# Patient Record
Sex: Male | Born: 1980
Health system: Southern US, Community
[De-identification: ages and names within clinical notes are randomized; demographics above are authoritative.]

## PROBLEM LIST (undated history)

## (undated) DIAGNOSIS — E785 Hyperlipidemia, unspecified: Secondary | ICD-10-CM

## (undated) DIAGNOSIS — N200 Calculus of kidney: Secondary | ICD-10-CM

## (undated) HISTORY — PX: KIDNEY STONE SURGERY: SHX686

## (undated) HISTORY — DX: Hyperlipidemia, unspecified: E78.5

## (undated) HISTORY — DX: Calculus of kidney: N20.0

---

## 2018-12-25 DIAGNOSIS — N200 Calculus of kidney: Secondary | ICD-10-CM | POA: Diagnosis not present

## 2019-06-26 DIAGNOSIS — N200 Calculus of kidney: Secondary | ICD-10-CM | POA: Diagnosis not present

## 2019-06-27 DIAGNOSIS — N202 Calculus of kidney with calculus of ureter: Secondary | ICD-10-CM | POA: Diagnosis not present

## 2019-07-26 DIAGNOSIS — Z03818 Encounter for observation for suspected exposure to other biological agents ruled out: Secondary | ICD-10-CM | POA: Diagnosis not present

## 2019-08-16 DIAGNOSIS — N200 Calculus of kidney: Secondary | ICD-10-CM | POA: Diagnosis not present

## 2019-08-29 ENCOUNTER — Other Ambulatory Visit: Payer: Self-pay

## 2019-08-29 ENCOUNTER — Ambulatory Visit (INDEPENDENT_AMBULATORY_CARE_PROVIDER_SITE_OTHER): Payer: BC Managed Care – PPO | Admitting: Family Medicine

## 2019-08-29 ENCOUNTER — Encounter: Payer: Self-pay | Admitting: Family Medicine

## 2019-08-29 VITALS — BP 102/72 | HR 67 | Temp 97.0°F | Ht 66.0 in | Wt 179.0 lb

## 2019-08-29 DIAGNOSIS — M79672 Pain in left foot: Secondary | ICD-10-CM

## 2019-08-29 DIAGNOSIS — N200 Calculus of kidney: Secondary | ICD-10-CM

## 2019-08-29 DIAGNOSIS — E785 Hyperlipidemia, unspecified: Secondary | ICD-10-CM

## 2019-08-29 NOTE — Patient Instructions (Addendum)

## 2019-08-29 NOTE — Progress Notes (Signed)
Chief Complaint  Patient presents with  . New Patient (Initial Visit)       New Patient Visit SUBJECTIVE: HPI: Devin Bauer is an 39 y.o.male who is being seen for establishing care.  +several year hx of recurrent kidney stones. Was told in [place] that he has Ca oxalate stones. Had a 24 hr collection showing increased Ca. Also has a stone analysis pending with urology in the area. He is taking Flomax 0.4 mg daily for this. Unsure what he needs to do from a dietary standpoint. Continues to get these every 3-4 months and they are quite painful. Reports having had 30-40 in his life.   2 mo of L heel pain. Started walking 2 mo ago. No bruising, swelling or redness. Hurts at night and worse when walking. Did get new shoes. Has not tried anything else. Not changing in severity.  Hx of high cholesterol. Started on Lipitor 20 mg/d. Reports compliance, no AE's. Diet is healthy. Walking for exercise.   Past Medical History:  Diagnosis Date  . Hyperlipidemia   . Kidney stone    Past Surgical History:  Procedure Laterality Date  . KIDNEY STONE SURGERY     Family History  Problem Relation Age of Onset  . Hypertension Father    No Known Allergies  Current Outpatient Medications:  .  atorvastatin (LIPITOR) 20 MG tablet, Take 20 mg by mouth daily., Disp: , Rfl:  .  tamsulosin (FLOMAX) 0.4 MG CAPS capsule, Take 0.4 capsules by mouth daily., Disp: , Rfl:    OBJECTIVE: BP 102/72 (BP Location: Left Arm, Patient Position: Sitting, Cuff Size: Normal)   Pulse 67   Temp (!) 97 F (36.1 C) (Temporal)   Ht 5\' 6"  (1.676 m)   Wt 179 lb (81.2 kg)   SpO2 97%   BMI 28.89 kg/m  General:  well developed, well nourished, in no apparent distress Skin:  no significant moles, warts, or growths Nose:  nares patent, septum midline, mucosa normal Throat/Pharynx:  lips and gingiva without lesion; tongue and uvula midline; non-inflamed pharynx; no exudates or postnasal drainage Lungs:  clear to auscultation,  breath sounds equal bilaterally, no respiratory distress Cardio:  regular rate and rhythm, no LE edema or bruits Musculoskeletal: +ttp on bottom of heel (not insertion of plantar fascia); no chest wall ttp; symmetrical muscle groups noted without atrophy or deformity Neuro:  gait normal Psych: well oriented with normal range of affect and appropriate judgment/insight  ASSESSMENT/PLAN: Recurrent kidney stones  Pain of left heel  Hyperlipidemia, unspecified hyperlipidemia type  1- Await workup from urology. If no good plan, will refer to [facility]. Diet info provided today. 2- Heel cups. Supportive shoes. Podiatry if no better. 3- Cont statin. Counseled on diet/exercises.  Patient should return in 3 mo for CPE. The patient voiced understanding and agreement to the plan.   [facility] [place], DO 08/29/19  8:14 AM

## 2019-09-12 ENCOUNTER — Emergency Department (HOSPITAL_BASED_OUTPATIENT_CLINIC_OR_DEPARTMENT_OTHER)
Admission: EM | Admit: 2019-09-12 | Discharge: 2019-09-12 | Disposition: A | Discharge status: Home / Self Care | Payer: BC Managed Care – PPO | Attending: Emergency Medicine | Admitting: Emergency Medicine

## 2019-09-12 ENCOUNTER — Other Ambulatory Visit: Payer: Self-pay

## 2019-09-12 ENCOUNTER — Emergency Department (HOSPITAL_BASED_OUTPATIENT_CLINIC_OR_DEPARTMENT_OTHER): Payer: BC Managed Care – PPO

## 2019-09-12 ENCOUNTER — Encounter (HOSPITAL_BASED_OUTPATIENT_CLINIC_OR_DEPARTMENT_OTHER): Payer: Self-pay | Admitting: Emergency Medicine

## 2019-09-12 DIAGNOSIS — N201 Calculus of ureter: Secondary | ICD-10-CM | POA: Diagnosis not present

## 2019-09-12 DIAGNOSIS — R109 Unspecified abdominal pain: Secondary | ICD-10-CM

## 2019-09-12 DIAGNOSIS — R1032 Left lower quadrant pain: Secondary | ICD-10-CM | POA: Diagnosis not present

## 2019-09-12 LAB — CBC WITH DIFFERENTIAL/PLATELET
Abs Immature Granulocytes: 0.02 10*3/uL (ref 0.00–0.07)
Basophils Absolute: 0 10*3/uL (ref 0.0–0.1)
Basophils Relative: 1 %
Eosinophils Absolute: 0.1 10*3/uL (ref 0.0–0.5)
Eosinophils Relative: 2 %
HCT: 42.2 % (ref 39.0–52.0)
Hemoglobin: 14.1 g/dL (ref 13.0–17.0)
Immature Granulocytes: 0 %
Lymphocytes Relative: 38 %
Lymphs Abs: 2.7 10*3/uL (ref 0.7–4.0)
MCH: 29.6 pg (ref 26.0–34.0)
MCHC: 33.4 g/dL (ref 30.0–36.0)
MCV: 88.5 fL (ref 80.0–100.0)
Monocytes Absolute: 0.4 10*3/uL (ref 0.1–1.0)
Monocytes Relative: 6 %
Neutro Abs: 3.8 10*3/uL (ref 1.7–7.7)
Neutrophils Relative %: 53 %
Platelets: 272 10*3/uL (ref 150–400)
RBC: 4.77 MIL/uL (ref 4.22–5.81)
RDW: 12.5 % (ref 11.5–15.5)
WBC: 7.1 10*3/uL (ref 4.0–10.5)
nRBC: 0 % (ref 0.0–0.2)

## 2019-09-12 LAB — BASIC METABOLIC PANEL
Anion gap: 11 (ref 5–15)
BUN: 7 mg/dL (ref 6–20)
CO2: 24 mmol/L (ref 22–32)
Calcium: 8.9 mg/dL (ref 8.9–10.3)
Chloride: 102 mmol/L (ref 98–111)
Creatinine, Ser: 1.22 mg/dL (ref 0.61–1.24)
GFR calc Af Amer: >60 mL/min (ref >60)
GFR calc non Af Amer: >60 mL/min (ref >60)
Glucose, Bld: 139 mg/dL — ABNORMAL HIGH (ref 70–99)
Potassium: 3.7 mmol/L (ref 3.5–5.1)
Sodium: 137 mmol/L (ref 135–145)

## 2019-09-12 LAB — URINALYSIS, MICROSCOPIC (REFLEX)

## 2019-09-12 LAB — URINALYSIS, ROUTINE W REFLEX MICROSCOPIC
Bilirubin Urine: NEGATIVE
Glucose, UA: NEGATIVE mg/dL
Ketones, ur: NEGATIVE mg/dL
Leukocytes,Ua: NEGATIVE
Nitrite: NEGATIVE
Protein, ur: NEGATIVE mg/dL
Specific Gravity, Urine: 1.015 (ref 1.005–1.030)
pH: 7 (ref 5.0–8.0)

## 2019-09-12 MED ORDER — HYDROMORPHONE HCL 1 MG/ML IJ SOLN
INTRAMUSCULAR | Status: AC
  Administered 2019-09-12: 1 mg via INTRAVENOUS
  Filled 2019-09-12: qty 1

## 2019-09-12 MED ORDER — ONDANSETRON HCL 4 MG/2ML IJ SOLN: 4.0000 mg | Freq: Once | INTRAMUSCULAR | Status: AC

## 2019-09-12 MED ORDER — TAMSULOSIN HCL 0.4 MG PO CAPS: 0.4000 mg | ORAL_CAPSULE | Freq: Every day | ORAL | 0 refills | Status: DC

## 2019-09-12 MED ORDER — HYDROMORPHONE HCL 1 MG/ML IJ SOLN: 1.0000 mg | Freq: Once | INTRAMUSCULAR | Status: AC

## 2019-09-12 MED ORDER — ONDANSETRON HCL 4 MG/2ML IJ SOLN
INTRAMUSCULAR | Status: AC
  Administered 2019-09-12: 4 mg via INTRAVENOUS
  Filled 2019-09-12: qty 2

## 2019-09-12 NOTE — Discharge Instructions (Signed)
You were seen in the emergency department and found to have a kidney stone.  We are sending you home with multiple medications to assist with passing the stone:   -Flomax-this is a medication to help pass the stone, it allows urine to exit the body more freely.  Please take this once daily with a meal.  -Ibuprofen 800 mg-this is a medication that will help with pain as well as passing the stone.  Please take this every 8 hours.  Take this with food as it can cause stomach upset and at worst stomach bleeding.  Do not take other NSAIDs such as Motrin, Aleve, Advil, Mobic, or Naproxen with this medicine as they are similar and would propagate any potential side effects.   Oxycodone-this is a narcotic/controlled substance medication that has potential addicting qualities.  We recommend that you take 1-2 tablets every 6 hours as needed for severe pain.  Do not drive or operate heavy machinery when taking this medicine as it can be sedating. Do not drink alcohol or take other sedating medications when taking this medicine for safety reasons.  Keep this out of reach of small children.  Please be aware this medicine has Tylenol in it (325 mg/tab) do not exceed the maximum dose of Tylenol in a day per over the counter recommendations should you decide to supplement with Tylenol over the counter.    Please follow-up with your urologist.  Return to the ER for new or worsening symptoms including but not limited to worsening pain not controlled by these medicines, inability to keep fluids down, fever, or any other concerns that you may have.

## 2019-09-12 NOTE — ED Notes (Signed)
Pt c/o left flank pain 10/10.  Dr. Donnald Garre notified, orders received.

## 2019-09-12 NOTE — ED Triage Notes (Signed)
Pt here with left flank pain since this morning and knows it is a kidney stone. Hx of kidney stones

## 2019-09-12 NOTE — ED Provider Notes (Signed)
[facility] EMERGENCY DEPARTMENT Provider Note   CSN: [id number] Arrival date & time: 09/12/19  1000     History Chief Complaint  Patient presents with  . Flank Pain    Devin Bauer is a 39 y.o. male.  Devin Bauer is a 39 y.o. male with a history of hyperlipidemia and kidney stones, who presents for sudden onset of severe left flank pain that began at about 8:15 this morning.  Pain is present in the left lower quadrant and radiates up to the left flank and down to the groin.  Pain is constant sharp and stabbing.  No associated nausea or vomiting.  No diarrhea or constipation.  He has not had any burning or pain with urination and has not noted any blood in his urine but he states he does not usually see this with his previous kidney stones.  States he has passed a total of 15 previous kidney stones, did have to have lithotripsy once but has not had to have any stenting or surgeries previously.  States that his stones are usually calcium oxalate and his urologist, Dr. Pete Glatter at [facility] has asked him to start drinking large amounts of water with lemon to try to reduce the number of stones he has.  Nothing for pain prior to arrival.  No other aggravating or alleviating factors.        Past Medical History:  Diagnosis Date  . Hyperlipidemia   . Kidney stone     Patient Active Problem List   Diagnosis Date Noted  . Recurrent kidney stones 08/29/2019  . Hyperlipidemia 08/29/2019    Past Surgical History:  Procedure Laterality Date  . KIDNEY STONE SURGERY         Family History  Problem Relation Age of Onset  . Hypertension Father     Social History   Tobacco Use  . Smoking status: Never Smoker  . Smokeless tobacco: Never Used  Substance Use Topics  . Alcohol use: Yes    Comment: once a week a beer  . Drug use: Never    Home Medications Prior to Admission medications   Medication Sig Start Date End Date Taking? Authorizing Provider    atorvastatin (LIPITOR) 20 MG tablet Take 20 mg by mouth daily. 07/18/19   [provider]  tamsulosin (FLOMAX) 0.4 MG CAPS capsule Take 1 capsule (0.4 mg total) by mouth daily. 09/12/19   Dartha Lodge, PA-C    Allergies    Patient has no known allergies.  Review of Systems   Review of Systems  Physical Exam Updated Vital Signs BP 122/88 (BP Location: Left Arm)   Pulse 71   Temp 98.2 F (36.8 C) (Oral)   Resp 16   SpO2 100%   Physical Exam Vitals and nursing note reviewed.  Constitutional:      General: He is not in acute distress.    Appearance: Normal appearance. He is well-developed. He is not diaphoretic.     Comments: Patient appears uncomfortable on arrival but is in no acute distress  HENT:     Head: Normocephalic and atraumatic.  Eyes:     General:        Right eye: No discharge.        Left eye: No discharge.     Pupils: Pupils are equal, round, and reactive to light.  Cardiovascular:     Rate and Rhythm: Normal rate and regular rhythm.     Heart sounds: Normal heart sounds.  Pulmonary:  Effort: Pulmonary effort is normal. No respiratory distress.     Breath sounds: Normal breath sounds. No wheezing or rales.     Comments: Respirations equal and unlabored, patient able to speak in full sentences, lungs clear to auscultation bilaterally Abdominal:     General: Bowel sounds are normal. There is no distension.     Palpations: Abdomen is soft. There is no mass.     Tenderness: There is abdominal tenderness. There is left CVA tenderness. There is no guarding.     Comments: Abdomen soft, nondistended, bowel sounds present throughout, there is some tenderness in the left lower quadrant without guarding, all other quadrants nontender, patient with left-sided CVA tenderness.  Musculoskeletal:        General: No deformity.     Cervical back: Neck supple.  Skin:    General: Skin is warm and dry.     Capillary Refill: Capillary refill takes less than 2  seconds.  Neurological:     Mental Status: He is alert.     Coordination: Coordination normal.     Comments: Speech is clear, able to follow commands CN III-XII intact Normal strength in upper and lower extremities bilaterally including dorsiflexion and plantar flexion, strong and equal grip strength Sensation normal to light and sharp touch Moves extremities without ataxia, coordination intact  Psychiatric:        Mood and Affect: Mood normal.        Behavior: Behavior normal.     ED Results / Procedures / Treatments   Labs (all labs ordered are listed, but only abnormal results are displayed) Labs Reviewed  URINALYSIS, ROUTINE W REFLEX MICROSCOPIC - Abnormal; Notable for the following components:      Result Value   Hgb urine dipstick MODERATE (*)    All other components within normal limits  BASIC METABOLIC PANEL - Abnormal; Notable for the following components:   Glucose, Bld 139 (*)    All other components within normal limits  URINALYSIS, MICROSCOPIC (REFLEX) - Abnormal; Notable for the following components:   Bacteria, UA FEW (*)    All other components within normal limits  CBC WITH DIFFERENTIAL/PLATELET    EKG None  Radiology CT Renal Stone Study  Result Date: 09/12/2019 CLINICAL DATA:  Flank pain.  Kidney stone expected. EXAM: CT ABDOMEN AND PELVIS WITHOUT CONTRAST TECHNIQUE: Multidetector CT imaging of the abdomen and pelvis was performed following the standard protocol without IV contrast. COMPARISON:  None. FINDINGS: Lower chest: Lung bases are clear. Hepatobiliary: No focal hepatic lesion. No biliary duct dilatation. Gallbladder is normal. Common bile duct is normal. Pancreas: Pancreas is normal. No ductal dilatation. No pancreatic inflammation. Spleen: Normal spleen Adrenals/urinary tract: Adrenal glands are normal. Mild LEFT pelvicaliectasis and LEFT hydroureter. There is an obstructing calculus in the distal LEFT ureter at the vesicoureteral junction measuring 4  mm on image 73/2. There are 4 additional nonobstructing calculi within the LEFT kidney ranging size from 2-4 mm. Five calculi in the RIGHT kidney ranging size from 1-6 mm. No RIGHT hydronephrosis or hydroureter. There is a nonobstructing calculus in the distal RIGHT ureter measuring 4 mm (image 67/2). This distal RIGHT ureteral calculus is approximately 2 cm from the RIGHT vesicoureteral junction. No bladder calculi. Stomach/Bowel: Stomach, small bowel, appendix, and cecum are normal. The colon and rectosigmoid colon are normal. Vascular/Lymphatic: Abdominal aorta is normal caliber. No periportal or retroperitoneal adenopathy. No pelvic adenopathy. Reproductive: Prostate unremarkable Other: No free fluid. Musculoskeletal: Is IMPRESSION: 1. Obstructing calculus in the distal  LEFT ureter at the LEFT vesicoureteral junction. 2. Nonobstructing calculus in the distal RIGHT ureter several cm from the RIGHT vesicoureteral junction. 3. Bilateral nephrolithiasis. Electronically Signed   By: Suzy Bouchard M.D.   On: 09/12/2019 12:04    Procedures Procedures (including critical care time)  Medications Ordered in ED Medications  HYDROmorphone (DILAUDID) injection 1 mg (1 mg Intravenous Given 09/12/19 1037)  ondansetron (ZOFRAN) injection 4 mg (4 mg Intravenous Given 09/12/19 1037)    ED Course  I have reviewed the triage vital signs and the nursing notes.  Pertinent labs & imaging results that were available during my care of the patient were reviewed by me and considered in my medical decision making (see chart for details).    MDM Rules/Calculators/A&P                          Patient presents to the ED with complaints of flank/abdominal pain history of multiple kidney stones previously and this feels similar, is followed by Dr. Felipa Eth with [facility] urology in [facility]. Patient nontoxic appearing, vitals without significant abnormalities. On exam patient is tender left lower quadrant and over the  left flank, no peritoneal signs. DDX: nephrolithiasis, pyelonephritis/UTI, cholecystitis, pancreatitis, bowel obstruction/perforation, appendicitis, dissection, feel nephrolithiasis is most likely at this time will evaluate with labs, analgesics, anti-emetics, and fluids ordered.  Had shared decision-making discussion with patient given that this feels like his usual kidney stone and he has consistent history of the same feel it is reasonable to start with labs and pain control, but patient states he has not had imaging regarding his kidney stones in about 4 years, he has been trying methods to reduce his production of kidney stones as recommended by your urologist and would like to know how many stones he has in his kidneys at this point.  No leukocytosis, anemia, or significant electrolyte derangements.  CT renal study with 4 mm left-sided obstructing UVJ stone with associated left-sided hydro-, patient also has a nonobstructing calculus in the right distal ureter, 4 intrarenal stones noted on the right, and 5 on the left. Renal function preserved. Urinalysis without appearance of superimposed infection. Patient tolerating PO in the ER with pain well controlled. Will discharge home with Flomax, NSAID, and oxycodone with urology follow up.  Patient has already been prescribed oxycodone by his urologist and states he has plenty at home.  I discussed results, treatment plan, need for urology follow-up, and return precautions with the patient. Provided opportunity for questions, patient confirmed understanding and is in agreement with plan.   Final Clinical Impression(s) / ED Diagnoses Final diagnoses:  Left flank pain  Left ureteral calculus    Rx / DC Orders ED Discharge Orders         Ordered    tamsulosin (FLOMAX) 0.4 MG CAPS capsule  Daily     Discontinue  Reprint     09/12/19 1331           Benedetto Goad [place], [place] 09/12/19 1752    Charlesetta Shanks, MD 09/13/19 1150

## 2019-09-14 ENCOUNTER — Other Ambulatory Visit: Payer: Self-pay | Admitting: Family Medicine

## 2019-09-14 DIAGNOSIS — N200 Calculus of kidney: Secondary | ICD-10-CM

## 2019-11-02 DIAGNOSIS — N200 Calculus of kidney: Secondary | ICD-10-CM | POA: Diagnosis not present

## 2019-11-08 DIAGNOSIS — N200 Calculus of kidney: Secondary | ICD-10-CM | POA: Diagnosis not present

## 2019-12-05 ENCOUNTER — Encounter: Payer: Self-pay | Admitting: Family Medicine

## 2019-12-05 ENCOUNTER — Other Ambulatory Visit: Payer: Self-pay

## 2019-12-05 ENCOUNTER — Ambulatory Visit (INDEPENDENT_AMBULATORY_CARE_PROVIDER_SITE_OTHER): Payer: BC Managed Care – PPO | Admitting: Family Medicine

## 2019-12-05 VITALS — BP 108/80 | HR 77 | Temp 97.9°F | Ht 66.0 in | Wt 168.5 lb

## 2019-12-05 DIAGNOSIS — M722 Plantar fascial fibromatosis: Secondary | ICD-10-CM

## 2019-12-05 NOTE — Patient Instructions (Addendum)
Ice/cold pack over area for 10-15 min twice daily.  OK to take Tylenol 1000 mg (2 extra strength tabs) or 975 mg (3 regular strength tabs) every 6 hours as needed.  Ibuprofen 400-600 mg (2-3 over the counter strength tabs) every 6 hours as needed for pain.  Consider a [place] sock to wear at night.   Plantar Fasciitis Stretches/exercises Do exercises exactly as told by your health care provider and adjust them as directed. It is normal to feel mild stretching, pulling, tightness, or discomfort as you do these exercises, but you should stop right away if you feel sudden pain or your pain gets worse.   Stretching and range of motion exercises These exercises warm up your muscles and joints and improve the movement and flexibility of your foot. These exercises also help to relieve pain.  Exercise A: Plantar fascia stretch 1. Sit with your left / right leg crossed over your opposite knee. 2. Hold your heel with one hand with that thumb near your arch. With your other hand, hold your toes and gently pull them back toward the top of your foot. You should feel a stretch on the bottom of your toes or your foot or both. 3. Hold this stretch for 30 seconds. 4. Slowly release your toes and return to the starting position. Repeat 2 times. Complete this exercise 3 times per week.  Exercise B: Gastroc, standing 1. Stand with your hands against a wall. 2. Extend your left / right leg behind you, and bend your front knee slightly. 3. Keeping your heels on the floor and keeping your back knee straight, shift your weight toward the wall without arching your back. You should feel a gentle stretch in your left / right calf. 4. Hold this position for 30 seconds. Repeat 2 times. Complete this exercise 3 times a week. Exercise C: Soleus, standing 1. Stand with your hands against a wall. 2. Extend your left / right leg behind you, and bend your front knee slightly. 3. Keeping your heels on the floor, bend  your back knee and slightly shift your weight over the back leg. You should feel a gentle stretch deep in your calf. 4. Hold this position for 30 seconds. Repeat 2 times. Complete this exercise 3 times per week. Exercise D: Gastrocsoleus, standing 1. Stand with the ball of your left / right foot on a step. The ball of your foot is on the walking surface, right under your toes. 2. Keep your other foot firmly on the same step. 3. Hold onto the wall or a railing for balance. 4. Slowly lift your other foot, allowing your body weight to press your heel down over the edge of the step. You should feel a stretch in your left / right calf. 5. Hold this position for 30 seconds. 6. Return both feet to the step. 7. Repeat this exercise with a slight bend in your left / right knee. Repeat 2 times with your left / right knee straight and 2times with your left / right knee bent. Complete this exercise 3 times a week.  Balance exercise This exercise builds your balance and strength control of your arch to help take pressure off your plantar fascia. Exercise E: Single leg stand 1. Without shoes, stand near a railing or in a doorway. You may hold onto the railing or door frame as needed. 2. Stand on your left / right foot. Keep your big toe down on the floor and try to keep your arch  lifted. Do not let your foot roll inward. 3. Hold this position for 30 seconds. 4. If this exercise is too easy, you can try it with your eyes closed or while standing on a pillow. Repeat 2 times. Complete this exercise 3 times per week. This information is not intended to replace advice given to you by your health care provider. Make sure you discuss any questions you have with your health care provider. Document Released: 03/15/2005 Document Revised: 11/18/2015 Document Reviewed: 01/27/2015 Elsevier Interactive Patient Education  2017 [employer].

## 2019-12-05 NOTE — Progress Notes (Signed)
Musculoskeletal Exam  Patient: Devin Bauer DOB: 06-Feb-1981  DOS: 12/05/2019  SUBJECTIVE:  Chief Complaint:   Chief Complaint  Patient presents with   Leg Pain   Foot Pain    Ollie Esty is a 39 y.o.  male for evaluation and treatment of L heel pain.   Onset:  4 months ago. No inj or change in activity.  Location: L heel Character:  aching and sharp  Progression of issue:  is unchanged Associated symptoms: No swelling, bruising, redness Treatment: to date has been rest and ice.   Neurovascular symptoms: no Worse in AM.  Got new shoes with better support.   Past Medical History:  Diagnosis Date   Hyperlipidemia    Kidney stone     Objective: VITAL SIGNS: BP 108/80 (BP Location: Left Arm, Patient Position: Sitting, Cuff Size: Normal)    Pulse 77    Temp 97.9 F (36.6 C) (Oral)    Ht 5\' 6"  (1.676 m)    Wt 168 lb 8 oz (76.4 kg)    SpO2 99%    BMI 27.20 kg/m  Constitutional: Well formed, well developed. No acute distress. Thorax & Lungs: No accessory muscle use Musculoskeletal: L heel.   Tenderness to palpation: yes over heel and prox insertion of PF Deformity: no Ecchymosis: no Neurologic: Normal sensory function.  Psychiatric: Normal mood. Age appropriate judgment and insight. Alert & oriented x 3.    Assessment:  Plantar fasciitis  Plan: Stretches/exercises, heat, ice, Tylenol. [place] sock.  F/u as originally scheduled in 3 weeks. If no improvement, would consider injection vs podiatry referral.  The patient voiced understanding and agreement to the plan.   [place], DO 12/05/19  8:05 AM

## 2019-12-11 ENCOUNTER — Encounter: Payer: BC Managed Care – PPO | Admitting: Family Medicine

## 2019-12-25 ENCOUNTER — Ambulatory Visit (INDEPENDENT_AMBULATORY_CARE_PROVIDER_SITE_OTHER): Payer: BC Managed Care – PPO | Admitting: Family Medicine

## 2019-12-25 ENCOUNTER — Encounter: Payer: Self-pay | Admitting: Family Medicine

## 2019-12-25 ENCOUNTER — Other Ambulatory Visit: Payer: Self-pay

## 2019-12-25 VITALS — BP 108/68 | HR 82 | Temp 98.0°F | Ht 66.0 in | Wt 166.2 lb

## 2019-12-25 DIAGNOSIS — Z1159 Encounter for screening for other viral diseases: Secondary | ICD-10-CM

## 2019-12-25 DIAGNOSIS — E789 Disorder of lipoprotein metabolism, unspecified: Secondary | ICD-10-CM | POA: Diagnosis not present

## 2019-12-25 DIAGNOSIS — Z0000 Encounter for general adult medical examination without abnormal findings: Secondary | ICD-10-CM

## 2019-12-25 DIAGNOSIS — Z114 Encounter for screening for human immunodeficiency virus [HIV]: Secondary | ICD-10-CM | POA: Diagnosis not present

## 2019-12-25 DIAGNOSIS — M79672 Pain in left foot: Secondary | ICD-10-CM | POA: Diagnosis not present

## 2019-12-25 DIAGNOSIS — Z23 Encounter for immunization: Secondary | ICD-10-CM

## 2019-12-25 NOTE — Patient Instructions (Addendum)
Give [place] 2-3 business days to get the results of your labs back.   Keep the diet clean and stay active.  Do monthly self testicular checks in the shower. You are feeling for lumps/bumps that don't belong. If you feel anything like this, let me know!  If you do not hear anything about your referral in the next few days, call our office and ask for an update.  Let us know if you need anything.

## 2019-12-25 NOTE — Progress Notes (Signed)
Chief Complaint  Patient presents with  . Annual Exam    Well Male Devin Bauer is here for a complete physical.   His last physical was >1 year ago.  Current diet: in general, a "healthy" diet.   Current exercise: walking, cycling Weight trend: has lost 13 lbs intentionally Fatigue out of ordinary? No. Seat belt? Yes.    Health maintenance Tetanus- Yes HIV- No Hep C- No  Past Medical History:  Diagnosis Date  . Hyperlipidemia   . Kidney stone      Past Surgical History:  Procedure Laterality Date  . KIDNEY STONE SURGERY      Medications  Current Outpatient Medications on File Prior to Visit  Medication Sig Dispense Refill  . atorvastatin (LIPITOR) 20 MG tablet Take 20 mg by mouth daily.     Allergies No Known Allergies  Family History Family History  Problem Relation Age of Onset  . Hypertension Father     Review of Systems: Constitutional: no fevers or chills Eye:  no recent significant change in vision Ear/Nose/Mouth/Throat:  Ears:  no hearing loss Nose/Mouth/Throat:  no complaints of nasal congestion, no sore throat Cardiovascular:  no chest pain Respiratory:  no shortness of breath Gastrointestinal:  no abdominal pain, no change in bowel habits GU:  Male: negative for dysuria Musculoskeletal/Extremities: +continued L heel pain Integumentary (Skin/Breast):  no abnormal skin lesions reported Neurologic:  no headaches Endocrine: No unexpected weight changes Hematologic/Lymphatic:  no night sweats  Exam BP 108/68 (BP Location: Left Arm, Patient Position: Sitting, Cuff Size: Normal)   Pulse 82   Temp 98 F (36.7 C) (Oral)   Ht 5\' 6"  (1.676 m)   Wt 166 lb 4 oz (75.4 kg)   SpO2 99%   BMI 26.83 kg/m  General:  well developed, well nourished, in no apparent distress Skin:  no significant moles, warts, or growths Head:  no masses, lesions, or tenderness Eyes:  pupils equal and round, sclera anicteric without injection Ears:  canals without lesions,  TMs shiny without retraction, no obvious effusion, no erythema Nose:  nares patent, septum midline, mucosa normal Throat/Pharynx:  lips and gingiva without lesion; tongue and uvula midline; non-inflamed pharynx; no exudates or postnasal drainage Neck: neck supple without adenopathy, thyromegaly, or masses Lungs:  clear to auscultation, breath sounds equal bilaterally, no respiratory distress Cardio:  regular rate and rhythm, no bruits, no LE edema Abdomen:  abdomen soft, nontender; bowel sounds normal; no masses or organomegaly Genital (male): circumcised penis, no lesions or discharge; testes present bilaterally without masses or tenderness Rectal: Deferred Musculoskeletal:  symmetrical muscle groups noted without atrophy or deformity Extremities:  no clubbing, cyanosis, or edema, no deformities, no skin discoloration Neuro:  gait normal; deep tendon reflexes normal and symmetric Psych: well oriented with normal range of affect and appropriate judgment/insight  Assessment and Plan  Well adult exam - Plan: Lipid panel, Comprehensive metabolic panel, CBC  Need for influenza vaccination - Plan: Flu Vaccine QUAD 6+ mos PF IM (Fluarix Quad PF)  Pain of left heel - Plan: Ambulatory referral to Sports Medicine  Encounter for hepatitis C screening test for low risk patient - Plan: Hepatitis C antibody  Screening for HIV (human immunodeficiency virus) - Plan: HIV Antibody (routine testing w rflx)   Well 39 y.o. male. Counseled on diet and exercise. Self testicular exams recommended at least monthly.  Other orders as above. He is going to stop the Lipitor if numbers are good, I am OK with this but would  want to recheck in 2 months while off of it. Pt is OK with this plan.  Follow up in 1 year pending the above workup. The patient voiced understanding and agreement to the plan.  Jilda Roche Braddock, DO 12/25/19 10:08 AM

## 2019-12-26 ENCOUNTER — Other Ambulatory Visit: Payer: Self-pay | Admitting: Family Medicine

## 2019-12-26 DIAGNOSIS — E785 Hyperlipidemia, unspecified: Secondary | ICD-10-CM

## 2019-12-26 LAB — COMPREHENSIVE METABOLIC PANEL
AG Ratio: 2 (calc) (ref 1.0–2.5)
ALT: 29 U/L (ref 9–46)
AST: 24 U/L (ref 10–40)
Albumin: 4.9 g/dL (ref 3.6–5.1)
Alkaline phosphatase (APISO): 70 U/L (ref 36–130)
BUN: 7 mg/dL (ref 7–25)
CO2: 27 mmol/L (ref 20–32)
Calcium: 9.9 mg/dL (ref 8.6–10.3)
Chloride: 103 mmol/L (ref 98–110)
Creat: 1.14 mg/dL (ref 0.60–1.35)
Globulin: 2.4 g/dL (calc) (ref 1.9–3.7)
Glucose, Bld: 81 mg/dL (ref 65–99)
Potassium: 4.5 mmol/L (ref 3.5–5.3)
Sodium: 140 mmol/L (ref 135–146)
Total Bilirubin: 0.6 mg/dL (ref 0.2–1.2)
Total Protein: 7.3 g/dL (ref 6.1–8.1)

## 2019-12-26 LAB — CBC
HCT: 43.6 % (ref 38.5–50.0)
Hemoglobin: 14.2 g/dL (ref 13.2–17.1)
MCH: 28.5 pg (ref 27.0–33.0)
MCHC: 32.6 g/dL (ref 32.0–36.0)
MCV: 87.6 fL (ref 80.0–100.0)
MPV: 10.9 fL (ref 7.5–12.5)
Platelets: 249 10*3/uL (ref 140–400)
RBC: 4.98 10*6/uL (ref 4.20–5.80)
RDW: 13 % (ref 11.0–15.0)
WBC: 5.1 10*3/uL (ref 3.8–10.8)

## 2019-12-26 LAB — LIPID PANEL
Cholesterol: 96 mg/dL (ref <200)
HDL: 26 mg/dL — ABNORMAL LOW (ref >=40)
LDL Cholesterol (Calc): 49 mg/dL (calc)
Non-HDL Cholesterol (Calc): 70 mg/dL (calc) (ref <130)
Total CHOL/HDL Ratio: 3.7 (calc) (ref <5.0)
Triglycerides: 120 mg/dL (ref <150)

## 2019-12-26 LAB — HIV ANTIBODY (ROUTINE TESTING W REFLEX): HIV 1&2 Ab, 4th Generation: NONREACTIVE

## 2019-12-26 LAB — HEPATITIS C ANTIBODY
Hepatitis C Ab: NONREACTIVE
SIGNAL TO CUT-OFF: 0.01 (ref <1.00)

## 2020-01-04 ENCOUNTER — Ambulatory Visit: Payer: BC Managed Care – PPO | Admitting: Family Medicine

## 2020-01-04 NOTE — Progress Notes (Deleted)
   Subjective:    I'm seeing this patient as a consultation for:  Dr. Carmelia Roller. Note will be routed back to referring provider/PCP.  CC: L heel pain  I, Coye Dawood, LAT, ATC, am serving as scribe for Dr. Clementeen Graham.  HPI: Pt is a 39 y/o male presenting w/ c/o L heel pain.  He locates his pain to .  Radiating pain: Aggravating factors: Treatments tried:  Past medical history, Surgical history, Family history, Social history, Allergies, and medications have been entered into the medical record, reviewed. ***  Review of Systems: No new headache, visual changes, nausea, vomiting, diarrhea, constipation, dizziness, abdominal pain, skin rash, fevers, chills, night sweats, weight loss, swollen lymph nodes, body aches, joint swelling, muscle aches, chest pain, shortness of breath, mood changes, visual or auditory hallucinations.   Objective:   There were no vitals filed for this visit. General: Well Developed, well nourished, and in no acute distress.  Neuro/Psych: Alert and oriented x3, extra-ocular muscles intact, able to move all 4 extremities, sensation grossly intact. Skin: Warm and dry, no rashes noted.  Respiratory: Not using accessory muscles, speaking in full sentences, trachea midline.  Cardiovascular: Pulses palpable, no extremity edema. Abdomen: Does not appear distended. MSK: ***  Lab and Radiology Results No results found for this or any previous visit (from the past 72 hour(s)). No results found.  Impression and Recommendations:    Assessment and Plan: 39 y.o. male with ***.  PDMP not reviewed this encounter. No orders of the defined types were placed in this encounter.  No orders of the defined types were placed in this encounter.   Discussed warning signs or symptoms. Please see discharge instructions. Patient expresses understanding.   ***

## 2020-01-07 ENCOUNTER — Ambulatory Visit (INDEPENDENT_AMBULATORY_CARE_PROVIDER_SITE_OTHER): Payer: BC Managed Care – PPO | Admitting: Family Medicine

## 2020-01-07 ENCOUNTER — Encounter: Payer: Self-pay | Admitting: Family Medicine

## 2020-01-07 ENCOUNTER — Other Ambulatory Visit: Payer: Self-pay

## 2020-01-07 VITALS — BP 129/90 | HR 68 | Ht 66.0 in | Wt 167.0 lb

## 2020-01-07 DIAGNOSIS — M722 Plantar fascial fibromatosis: Secondary | ICD-10-CM | POA: Diagnosis not present

## 2020-01-07 MED ORDER — PENNSAID 2 % EX SOLN: 1.0000 "application " | Freq: Two times a day (BID) | CUTANEOUS | 2 refills | Status: DC

## 2020-01-07 NOTE — Assessment & Plan Note (Signed)
Findings consistent with plantar fasciitis.  Has a fairly flat foot which may be contributing. -Counseled on home exercise therapy and supportive care. -Midfoot arch strap. -Pennsaid and samples provided. -Referral to physical therapy. -Could consider injection.

## 2020-01-07 NOTE — Progress Notes (Signed)
Medication Samples have been provided to the patient.  Drug name: Pennsaid       Strength: 2%        Qty: 1 box  LOT: [id number]  Exp.Date: 05/2020  Dosing instructions: use a pea size amount and rub gently.  The patient has been instructed regarding the correct time, dose, and frequency of taking this medication, including desired effects and most common side effects.   Kathi Simpers, MA 10:11 AM 01/07/2020

## 2020-01-07 NOTE — Progress Notes (Signed)
Devin Bauer - 39 y.o. male MRN [id number]  Date of birth: 03-09-1981  SUBJECTIVE:  Including CC & ROS.  Chief Complaint  Patient presents with  . Foot Pain    left heel x 2-3 months    Devin Bauer is a 39 y.o. male that is presenting with left heel pain.  Pain is been ongoing for few months.  Denies any injury or inciting event.  Symptoms are staying the same.  Intermittent in nature.  Has tried over-the-counter remedies.  No history of surgery.   Review of Systems See HPI   HISTORY: Past Medical, Surgical, Social, and Family History Reviewed & Updated per EMR.   Pertinent Historical Findings include:  Past Medical History:  Diagnosis Date  . Hyperlipidemia   . Kidney stone     Past Surgical History:  Procedure Laterality Date  . KIDNEY STONE SURGERY      Family History  Problem Relation Age of Onset  . Hypertension Father     Social History   Socioeconomic History  . Marital status: Married    Spouse name: Not on file  . Number of children: Not on file  . Years of education: Not on file  . Highest education level: Not on file  Occupational History  . Not on file  Tobacco Use  . Smoking status: Never Smoker  . Smokeless tobacco: Never Used  Substance and Sexual Activity  . Alcohol use: Yes    Comment: once a week a beer  . Drug use: Never  . Sexual activity: Not on file  Other Topics Concern  . Not on file  Social History Narrative  . Not on file   Social Determinants of Health   Financial Resource Strain:   . Difficulty of Paying Living Expenses: Not on file  Food Insecurity:   . Worried About Programme researcher, broadcasting/film/video in the Last Year: Not on file  . Ran Out of Food in the Last Year: Not on file  Transportation Needs:   . Lack of Transportation (Medical): Not on file  . Lack of Transportation (Non-Medical): Not on file  Physical Activity:   . Days of Exercise per Week: Not on file  . Minutes of Exercise per Session: Not on file  Stress:   .  Feeling of Stress : Not on file  Social Connections:   . Frequency of Communication with Friends and Family: Not on file  . Frequency of Social Gatherings with Friends and Family: Not on file  . Attends Religious Services: Not on file  . Active Member of Clubs or Organizations: Not on file  . Attends Banker Meetings: Not on file  . Marital Status: Not on file  Intimate Partner Violence:   . Fear of Current or Ex-Partner: Not on file  . Emotionally Abused: Not on file  . Physically Abused: Not on file  . Sexually Abused: Not on file     PHYSICAL EXAM:  VS: BP 129/90   Pulse 68   Ht 5\' 6"  (1.676 m)   Wt 167 lb (75.8 kg)   BMI 26.95 kg/m  Physical Exam Gen: NAD, alert, cooperative with exam, well-appearing MSK:  Left foot: Tenderness palpation at the plantar calcaneus. Normal range of motion. More of a planus foot. Able to raise up on tiptoes. Neurovascularly intact     ASSESSMENT & PLAN:   Plantar fasciitis of left foot Findings consistent with plantar fasciitis.  Has a fairly flat foot which may be contributing. -Counseled  on home exercise therapy and supportive care. -Midfoot arch strap. -Pennsaid and samples provided. -Referral to physical therapy. -Could consider injection.

## 2020-01-07 NOTE — Patient Instructions (Signed)
Nice  to meet you Please try the rub on medicine  Please try the exercises  Please try the strap  Physical therapy will give you a call.   Please send me a message in MyChart with any questions or updates.  Please see me back in 4 weeks.   --Dr. Jordan Likes

## 2020-02-08 ENCOUNTER — Ambulatory Visit: Payer: BC Managed Care – PPO | Admitting: Family Medicine

## 2020-02-20 NOTE — Addendum Note (Signed)
Addended by: Janeece Blok A on: 02/20/2020 03:24 PM   Modules accepted: Orders  

## 2020-02-25 ENCOUNTER — Other Ambulatory Visit: Payer: BC Managed Care – PPO

## 2020-03-11 ENCOUNTER — Other Ambulatory Visit: Payer: Self-pay

## 2020-04-10 ENCOUNTER — Other Ambulatory Visit: Payer: Self-pay

## 2020-04-11 ENCOUNTER — Other Ambulatory Visit (INDEPENDENT_AMBULATORY_CARE_PROVIDER_SITE_OTHER): Payer: BLUE CROSS/BLUE SHIELD

## 2020-04-11 ENCOUNTER — Other Ambulatory Visit: Payer: Self-pay

## 2020-04-11 DIAGNOSIS — E785 Hyperlipidemia, unspecified: Secondary | ICD-10-CM | POA: Diagnosis not present

## 2020-04-11 LAB — LIPID PANEL
Cholesterol: 206 mg/dL — ABNORMAL HIGH (ref 0–200)
HDL: 31.1 mg/dL — ABNORMAL LOW (ref >39.00)
LDL Cholesterol: 138 mg/dL — ABNORMAL HIGH (ref 0–99)
NonHDL: 175.15
Total CHOL/HDL Ratio: 7
Triglycerides: 184 mg/dL — ABNORMAL HIGH (ref 0.0–149.0)
VLDL: 36.8 mg/dL (ref 0.0–40.0)

## 2020-04-16 ENCOUNTER — Other Ambulatory Visit: Payer: Self-pay | Admitting: Family Medicine

## 2020-04-16 DIAGNOSIS — N200 Calculus of kidney: Secondary | ICD-10-CM

## 2021-05-29 ENCOUNTER — Telehealth: Payer: Self-pay | Admitting: Family Medicine

## 2021-05-29 ENCOUNTER — Ambulatory Visit (INDEPENDENT_AMBULATORY_CARE_PROVIDER_SITE_OTHER): Payer: BLUE CROSS/BLUE SHIELD | Admitting: Family Medicine

## 2021-05-29 ENCOUNTER — Encounter: Payer: Self-pay | Admitting: Family Medicine

## 2021-05-29 VITALS — BP 101/72 | HR 77 | Temp 98.1°F | Ht 65.0 in | Wt 164.5 lb

## 2021-05-29 DIAGNOSIS — G8929 Other chronic pain: Secondary | ICD-10-CM | POA: Diagnosis not present

## 2021-05-29 DIAGNOSIS — M222X2 Patellofemoral disorders, left knee: Secondary | ICD-10-CM

## 2021-05-29 DIAGNOSIS — M222X1 Patellofemoral disorders, right knee: Secondary | ICD-10-CM | POA: Diagnosis not present

## 2021-05-29 DIAGNOSIS — Z87442 Personal history of urinary calculi: Secondary | ICD-10-CM | POA: Diagnosis not present

## 2021-05-29 DIAGNOSIS — R519 Headache, unspecified: Secondary | ICD-10-CM | POA: Diagnosis not present

## 2021-05-29 DIAGNOSIS — Z0000 Encounter for general adult medical examination without abnormal findings: Secondary | ICD-10-CM | POA: Diagnosis not present

## 2021-05-29 LAB — CBC
HCT: 39.7 % (ref 39.0–52.0)
Hemoglobin: 13.4 g/dL (ref 13.0–17.0)
MCHC: 33.7 g/dL (ref 30.0–36.0)
MCV: 89.2 fl (ref 78.0–100.0)
Platelets: 233 10*3/uL (ref 150.0–400.0)
RBC: 4.45 Mil/uL (ref 4.22–5.81)
RDW: 12.8 % (ref 11.5–15.5)
WBC: 4.5 10*3/uL (ref 4.0–10.5)

## 2021-05-29 LAB — COMPREHENSIVE METABOLIC PANEL
ALT: 34 U/L (ref 0–53)
AST: 25 U/L (ref 0–37)
Albumin: 4.8 g/dL (ref 3.5–5.2)
Alkaline Phosphatase: 63 U/L (ref 39–117)
BUN: 8 mg/dL (ref 6–23)
CO2: 29 mEq/L (ref 19–32)
Calcium: 9.6 mg/dL (ref 8.4–10.5)
Chloride: 104 mEq/L (ref 96–112)
Creatinine, Ser: 1.13 mg/dL (ref 0.40–1.50)
GFR: 82.14 mL/min (ref >60.00)
Glucose, Bld: 91 mg/dL (ref 70–99)
Potassium: 4.8 mEq/L (ref 3.5–5.1)
Sodium: 140 mEq/L (ref 135–145)
Total Bilirubin: 0.7 mg/dL (ref 0.2–1.2)
Total Protein: 7.3 g/dL (ref 6.0–8.3)

## 2021-05-29 LAB — LIPID PANEL
Cholesterol: 108 mg/dL (ref 0–200)
HDL: 30.3 mg/dL — ABNORMAL LOW (ref >39.00)
LDL Cholesterol: 62 mg/dL (ref 0–99)
NonHDL: 77.94
Total CHOL/HDL Ratio: 4
Triglycerides: 78 mg/dL (ref 0.0–149.0)
VLDL: 15.6 mg/dL (ref 0.0–40.0)

## 2021-05-29 MED ORDER — OXYCODONE HCL 5 MG PO TABS: ORAL_TABLET | ORAL | 0 refills | Status: DC

## 2021-05-29 MED ORDER — POTASSIUM CITRATE ER 15 MEQ (1620 MG) PO TBCR: EXTENDED_RELEASE_TABLET | ORAL | Status: DC

## 2021-05-29 NOTE — Telephone Encounter (Signed)
Faxed office notes from today. ?

## 2021-05-29 NOTE — Progress Notes (Signed)
Chief Complaint  ?Patient presents with  ? Annual Exam  ? ? ?Well Male ?Devin Bauer is here for a complete physical.   ?His last physical was >1 year ago.  ?Current diet: in general, a "healthy" diet.   ?Current exercise: none ?Weight trend: stable ?Fatigue out of ordinary? No. ?Seat belt? Yes.   ?Advanced directive? No ? ?Health maintenance ?Tetanus- Yes ?HIV- Yes ?Hep C- Yes ? ?B/l knee pain ?Pain has been going on for a few months. No inj or change in activity. Going up/down stairs makes things worse. Sitting for long periods of time. Tried [employer] and ice packs at home. Denies swelling, bruising, redness, instability, catching, locking.  ? ?HA's ?He will get headaches once per week lasting when he wakes up and lasts for several hours. It is on the R side. It used to be once per month on average, but over the past few weeks it is more freq. Does not take meds for it. Has difficulty rating it. No vision changes, weakness, numbness/tingling, N/V, sensitivity to light/sound, difficulty swallowing/trouble w speech. Requesting to see a specialist. He does not snore at night. No personal or famhx of migraines.  ? ?Hx of kidney stones ?Patient has a history of kidney stones.  He was seeing a urologist, Dr. Felipa Eth, who has since retired.  He was taking oxycodone 5 mg every 4 hours as needed for a symptomatic stone.  He has not had one in 2 years.  His current prescription has expired and he is requesting a refill in addition to a referral to a new urologist.  He takes potassium citrate 15 mill equivalents daily.  He also does well to stay hydrated. ? ?Past Medical History:  ?Diagnosis Date  ? Hyperlipidemia   ? Kidney stone   ?  ? ?Past Surgical History:  ?Procedure Laterality Date  ? KIDNEY STONE SURGERY    ? ? ?Medications  ?Current Outpatient Medications on File Prior to Visit  ?Medication Sig Dispense Refill  ? atorvastatin (LIPITOR) 20 MG tablet Take 20 mg by mouth daily.    ? Diclofenac Sodium (PENNSAID) 2 %  SOLN Place 1 application onto the skin 2 (two) times daily. 112 g 2  ? ?Allergies ?No Known Allergies ? ?Family History ?Family History  ?Problem Relation Age of Onset  ? Hypertension Father   ? ? ?Review of Systems: ?Constitutional: no fevers or chills ?Eye:  no recent significant change in vision ?Ear/Nose/Mouth/Throat:  Ears:  no hearing loss ?Nose/Mouth/Throat:  no complaints of nasal congestion, no sore throat ?Cardiovascular:  no chest pain ?Respiratory:  no shortness of breath ?Gastrointestinal:  no abdominal pain, no change in bowel habits ?GU:  Male: negative for dysuria ?Musculoskeletal/Extremities: +b/l knee pain ?Integumentary (Skin/Breast):  no abnormal skin lesions reported ?Neurologic:  +headaches ?Endocrine: No unexpected weight changes ?Hematologic/Lymphatic:  no night sweats ? ?Exam ?BP 101/72   Pulse 77   Temp 98.1 ?F (36.7 ?C) (Oral)   Ht 5\' 5"  (1.651 m)   Wt 164 lb 8 oz (74.6 kg)   SpO2 98%   BMI 27.37 kg/m?  ?General:  well developed, well nourished, in no apparent distress ?Skin:  no significant moles, warts, or growths ?Head:  no masses, lesions, or tenderness ?Eyes:  pupils equal and round, sclera anicteric without injection ?Ears:  canals without lesions, TMs shiny without retraction, no obvious effusion, no erythema ?Nose:  nares patent, septum midline, mucosa normal ?Throat/Pharynx:  lips and gingiva without lesion; tongue and uvula midline; non-inflamed  pharynx; no exudates or postnasal drainage ?Neck: neck supple without adenopathy, thyromegaly, or masses ?Lungs:  clear to auscultation, breath sounds equal bilaterally, no respiratory distress ?Cardio:  regular rate and rhythm, no bruits, no LE edema ?Abdomen:  abdomen soft, nontender; bowel sounds normal; no masses or organomegaly ?Genital (male): Deferred ?Rectal: Deferred ?Musculoskeletal:  symmetrical muscle groups noted without atrophy or deformity ?Knees: nml active/passive ROM. No defomrity, edema, effusion, warmth, ttp. Neg  Lachman's, Varus/valgus stress, patellar app/grind, McMurray's, Stine's. During flexion/extension, there was noted mal-tracking (popping) consistently felt b/l.  ?Extremities:  no clubbing, cyanosis, or edema, no deformities, no skin discoloration ?Neuro:  gait normal; deep tendon reflexes normal and symmetric ?Psych: well oriented with normal range of affect and appropriate judgment/insight ? ?Assessment and Plan ? ?Well adult exam - Plan: CBC, Comprehensive metabolic panel, Lipid panel ? ?Chronic nonintractable headache, unspecified headache type - Plan: Ambulatory referral to Neurology ? ?History of kidney stones - Plan: Ambulatory referral to Urology, Potassium Citrate 15 MEQ (1620 MG) TBCR, oxyCODONE (OXY IR/ROXICODONE) 5 MG immediate release tablet ? ?Patellofemoral arthralgia of both knees  ? ?Well 41 y.o. male. ?Counseled on diet and exercise. ?Self testicular exams recommended at least monthly.  ?Bivalent COVID vaccination booster recommended.  ?Unilateral headache. Chronic, worsening. Requested referral, referral placed. Offered prn med, politely declined. Neuro exam reassuring today. ?Hx of kidney stones: Chronic, stable. Cont K-citrate. Refer to [facility]. Prn oxycodone given until he establishes w urology. Current rx expired as he has not had a symptomatic stone in 2 years.  ?Knee pain: Chronic, not controlled. stretches/exercises. Heat, ice, Tylenol prn. Sports med referral vs PT if no better in 1 mo.  ?Advanced directive form provided today.  ?Other orders as above. ?Follow up in 1 year pending the above workup. ?The patient voiced understanding and agreement to the plan. ? ?Shelda Pal, DO ?05/29/21 ?7:39 AM ? ?

## 2021-05-29 NOTE — Telephone Encounter (Signed)
Headache wellness called and stated they do not have epic, so they can't see everything. They have the insurance, but also need ov notes on how we have treated pt's headache. I.e labs, rx. F: [phone]. Please advise.  ?

## 2021-05-29 NOTE — Patient Instructions (Addendum)
Give [place] 2-3 business days to get the results of your labs back.  ? ?Keep the diet clean and stay active. ? ?Do monthly self testicular checks in the shower. You are feeling for lumps/bumps that don't belong. If you feel anything like this, let me know! ? ?I recommend getting the updated bivalent covid vaccination booster at your convenience.  ? ?Please get me a copy of your advanced directive form at your convenience.  ? ?If you do not hear anything about your referrals in the next 1-2 weeks, call our office and ask for an update. ? ?Let us know if you need anything. ? ?Knee Exercises ?It is normal to feel mild stretching, pulling, tightness, or discomfort as you do these exercises, but you should stop right away if you feel sudden pain or your pain gets worse.  ?STRETCHING AND RANGE OF MOTION EXERCISES  ?These exercises warm up your muscles and joints and improve the movement and flexibility of your knee. These exercises also help to relieve pain, numbness, and tingling. ?Exercise A: Knee Extension, Prone  ?Lie on your abdomen on a bed. ?Place your left / right knee just beyond the edge of the surface so your knee is not on the bed. You can put a towel under your left / right thigh just above your knee for comfort. ?Relax your leg muscles and allow gravity to straighten your knee. You should feel a stretch behind your left / right knee. ?Hold this position for 30 seconds. ?Scoot up so your knee is supported between repetitions. ?Repeat 2 times. Complete this stretch 3 times per week. ?Exercise B: Knee Flexion, Active  ? ?  ?Lie on your back with both knees straight. If this causes back discomfort, bend your left / right knee so your foot is flat on the floor. ?Slowly slide your left / right heel back toward your buttocks until you feel a gentle stretch in the front of your knee or thigh. ?Hold this position for 30 seconds. ?Slowly slide your left / right heel back to the starting position. ?Repeat 2 times. Complete  this exercise 3 times per week. ?Exercise C: Quadriceps, Prone  ? ?  ?Lie on your abdomen on a firm surface, such as a bed or padded floor. ?Bend your left / right knee and hold your ankle. If you cannot reach your ankle or pant leg, loop a belt around your foot and grab the belt instead. ?Gently pull your heel toward your buttocks. Your knee should not slide out to the side. You should feel a stretch in the front of your thigh and knee. ?Hold this position for 30 seconds. ?Repeat 2 times. Complete this stretch 3 times per week. ?Exercise D: Hamstring, Supine  ?Lie on your back. ?Loop a belt or towel over the ball of your left / right foot. The ball of your foot is on the walking surface, right under your toes. ?Straighten your left / right knee and slowly pull on the belt to raise your leg until you feel a gentle stretch behind your knee. ?Do not let your left / right knee bend while you do this. ?Keep your other leg flat on the floor. ?Hold this position for 30 seconds. ?Repeat 2 times. Complete this stretch 3 times per week. ?STRENGTHENING EXERCISES  ?These exercises build strength and endurance in your knee. Endurance is the ability to use your muscles for a long time, even after they get tired. ?Exercise E: Quadriceps, Isometric  ? ?  ?  Lie on your back with your left / right leg extended and your other knee bent. Put a rolled towel or small pillow under your knee if told by your health care provider. ?Slowly tense the muscles in the front of your left / right thigh. You should see your kneecap slide up toward your hip or see increased dimpling just above the knee. This motion will push the back of the knee toward the floor. ?For 3 seconds, keep the muscle as tight as you can without increasing your pain. ?Relax the muscles slowly and completely. Repeat for 10 total reps ?Repeat 2 ti mes. Complete this exercise 3 times per week. ?Exercise F: Straight Leg Raises - Quadriceps  ?Lie on your back with your left /  right leg extended and your other knee bent. ?Tense the muscles in the front of your left / right thigh. You should see your kneecap slide up or see increased dimpling just above the knee. Your thigh may even shake a bit. ?Keep these muscles tight as you raise your leg 4-6 inches (10-15 cm) off the floor. Do not let your knee bend. ?Hold this position for 3 seconds. ?Keep these muscles tense as you lower your leg. ?Relax your muscles slowly and completely after each repetition. 10 total reps. ?Repeat 2 times. Complete this exercise 3 times per week. ? ?Exercise G: Hamstring Curls  ? ?  ?If told by your health care provider, do this exercise while wearing ankle weights. Begin with 5 lb weights (optional). Then increase the weight by 1 lb (0.5 kg) increments. Do not wear ankle weights that are more than 20 lbs to start with. ?Lie on your abdomen with your legs straight. ?Bend your left / right knee as far as you can without feeling pain. Keep your hips flat against the floor. ?Hold this position for 3 seconds. ?Slowly lower your leg to the starting position. Repeat for 10 reps.  ?Repeat 2 times. Complete this exercise 3 times per week. ?Exercise H: Squats (Quadriceps)  ?Stand in front of a table, with your feet and knees pointing straight ahead. You may rest your hands on the table for balance but not for support. ?Slowly bend your knees and lower your hips like you are going to sit in a chair. ?Keep your weight over your heels, not over your toes. ?Keep your lower legs upright so they are parallel with the table legs. ?Do not let your hips go lower than your knees. ?Do not bend lower than told by your health care provider. ?If your knee pain increases, do not bend as low. ?Hold the squat position for 1 second. ?Slowly push with your legs to return to standing. Do not use your hands to pull yourself to standing. ?Repeat 2 times. Complete this exercise 3 times per week. ?Exercise I: Wall Slides (Quadriceps)  ? ?  ?Lean  your back against a smooth wall or door while you walk your feet out 18-24 inches (46-61 cm) from it. ?Place your feet hip-width apart. ?Slowly slide down the wall or door until your knees Repeat 2 times. Complete this exercise every other day. ?Exercise K: Straight Leg Raises - Hip Abductors  ?Lie on your side with your left / right leg in the top position. Lie so your head, shoulder, knee, and hip line up. You may bend your bottom knee to help you keep your balance. ?Roll your hips slightly forward so your hips are stacked directly over each other and your left /  right knee is facing forward. ?Leading with your heel, lift your top leg 4-6 inches (10-15 cm). You should feel the muscles in your outer hip lifting. ?Do not let your foot drift forward. ?Do not let your knee roll toward the ceiling. ?Hold this position for 3 seconds. ?Slowly return your leg to the starting position. ?Let your muscles relax completely after each repetition. 10 total reps. ?Repeat 2 times. Complete this exercise 3 times per week. ?Exercise J: Straight Leg Raises - Hip Extensors  ?Lie on your abdomen on a firm surface. You can put a pillow under your hips if that is more comfortable. ?Tense the muscles in your buttocks and lift your left / right leg about 4-6 inches (10-15 cm). Keep your knee straight as you lift your leg. ?Hold this position for 3 seconds. ?Slowly lower your leg to the starting position. ?Let your leg relax completely after each repetition. ?Repeat 2 times. Complete this exercise 3 times per week. ?Document Released: 01/27/2005 Document Revised: 12/08/2015 Document Reviewed: 01/19/2015 ?[employer] Interactive Patient Education ? 2017 [employer] ? ?

## 2021-07-02 ENCOUNTER — Encounter: Payer: Self-pay | Admitting: Family Medicine

## 2021-07-30 IMAGING — CT CT RENAL STONE PROTOCOL
2 of 4 series · 16 of 46 positions shown, 18 images · non-contrast
Comparison: None.

CLINICAL DATA: Flank pain.  Kidney stone expected.

EXAM:
CT ABDOMEN AND PELVIS WITHOUT CONTRAST
TECHNIQUE: Multidetector CT imaging of the abdomen and pelvis was performed
following the standard protocol without IV contrast.

[Series 2: axial st · axial · 0.88mm/px · z∈[-471,-26]mm · 13 of 99 slices shown, 15 images]
[im 5/99  soft-tissue]
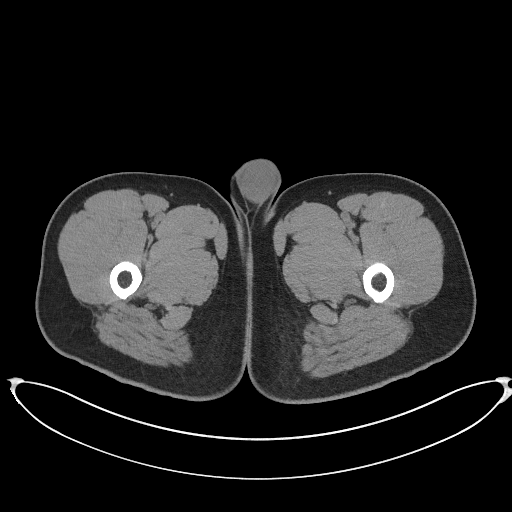
[im 5/99  bone]
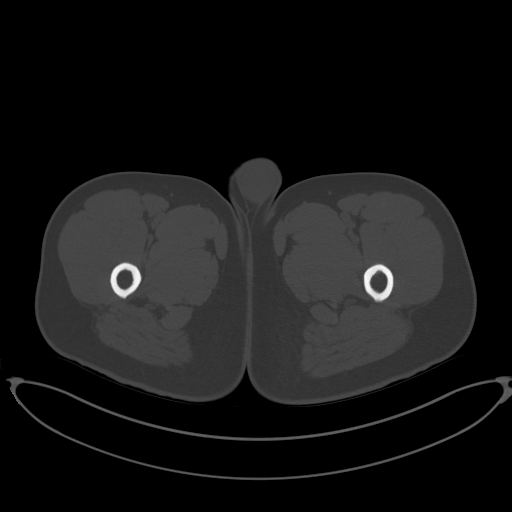
[im 13/99  soft-tissue]
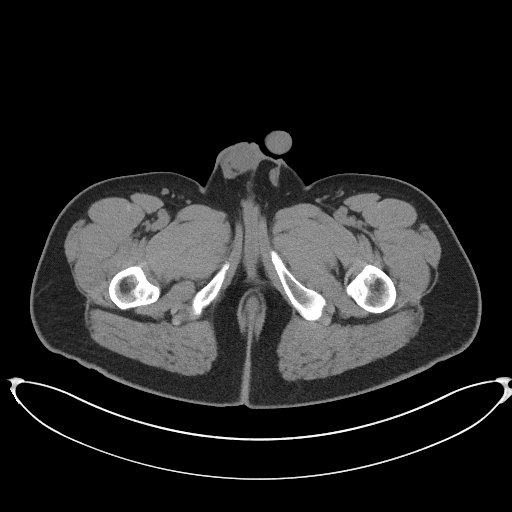
[im 22/99  soft-tissue]
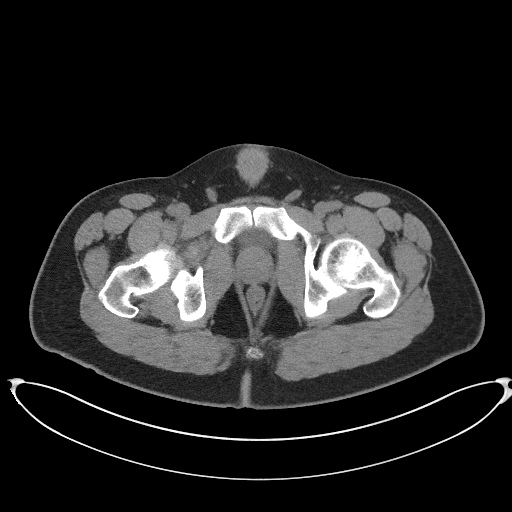
[im 26/99  soft-tissue]
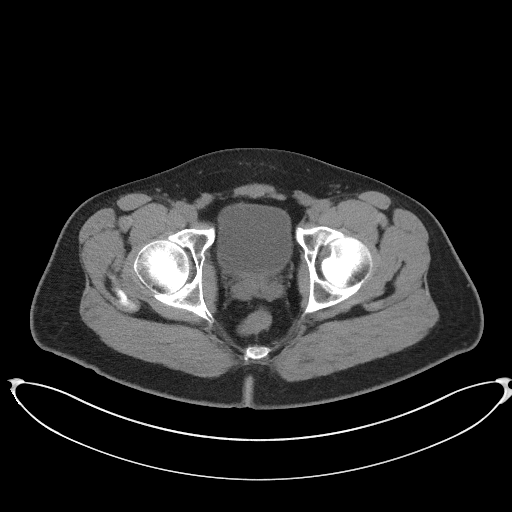
[im 35/99  soft-tissue]
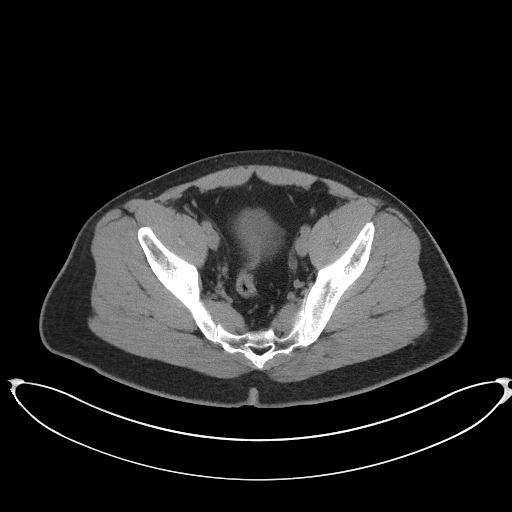
[im 43/99  soft-tissue]
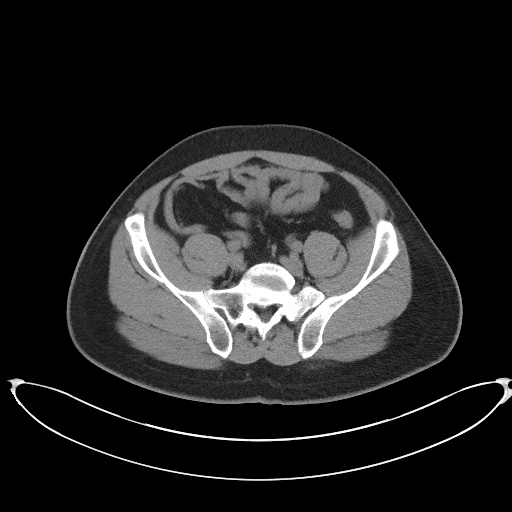
[im 52/99  soft-tissue]
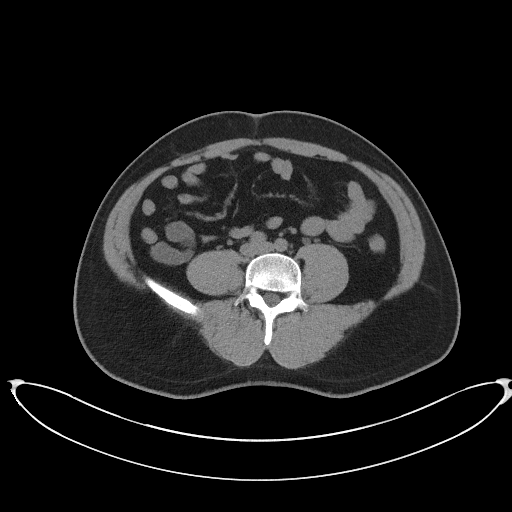
[im 56/99  soft-tissue]
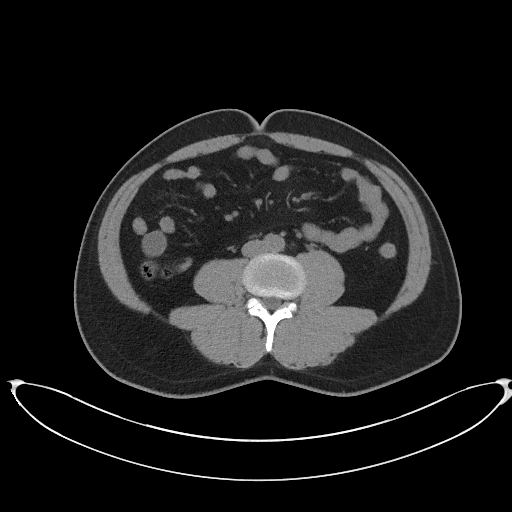
[im 64/99  soft-tissue]
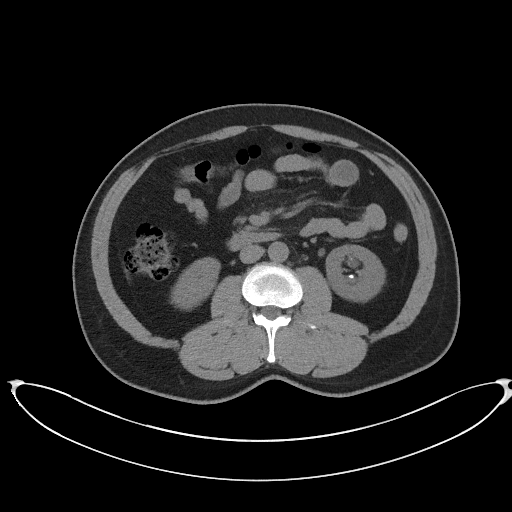
[im 64/99  bone]
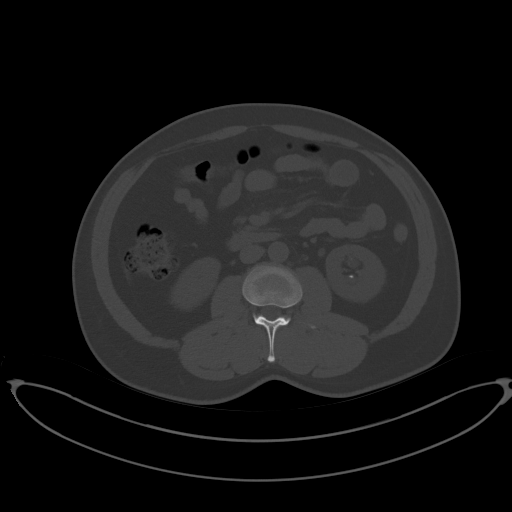
[im 73/99  soft-tissue]
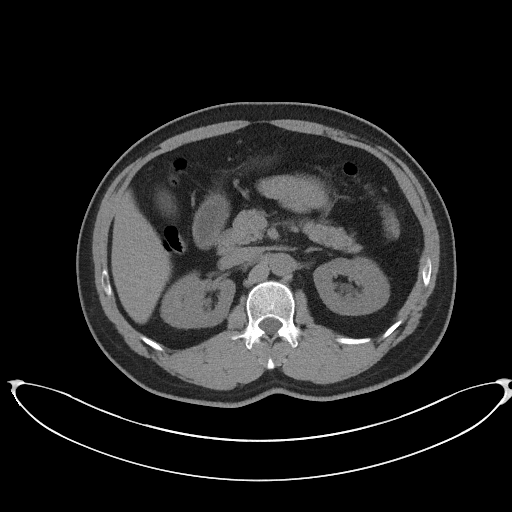
[im 77/99  soft-tissue]
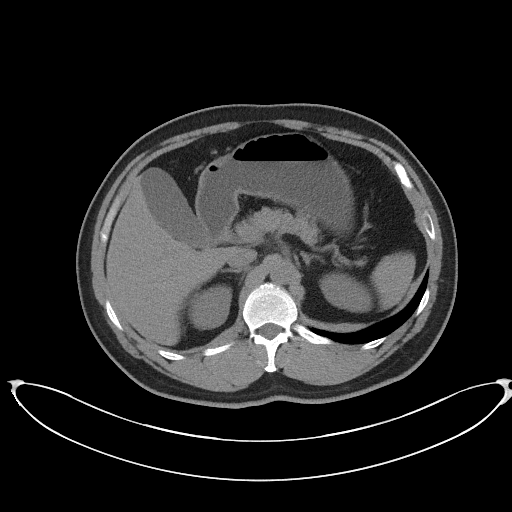
[im 86/99  soft-tissue]
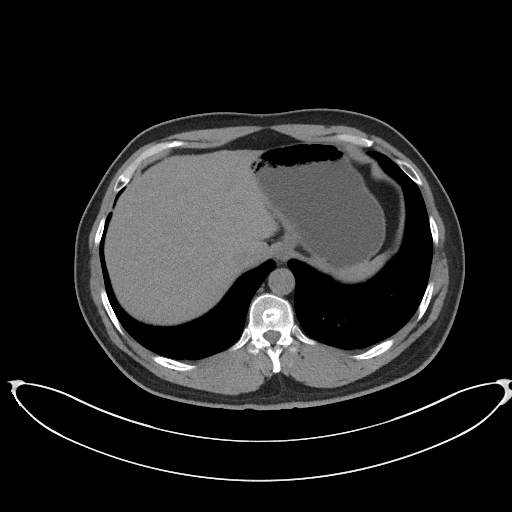
[im 94/99  soft-tissue]
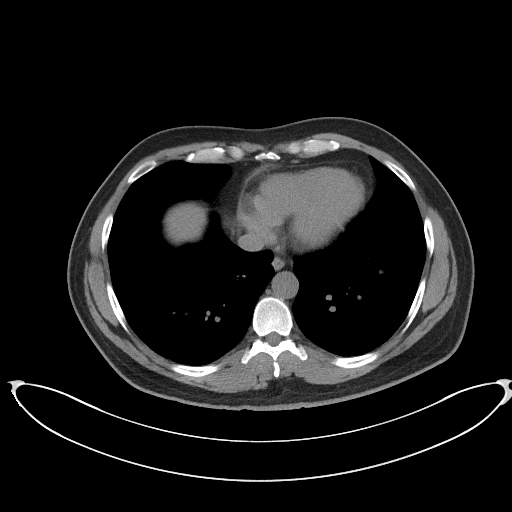

[Series 5: coronal st · coronal · 0.75mm/px · 3 of 92 slices shown]
[im 31/92  soft-tissue]
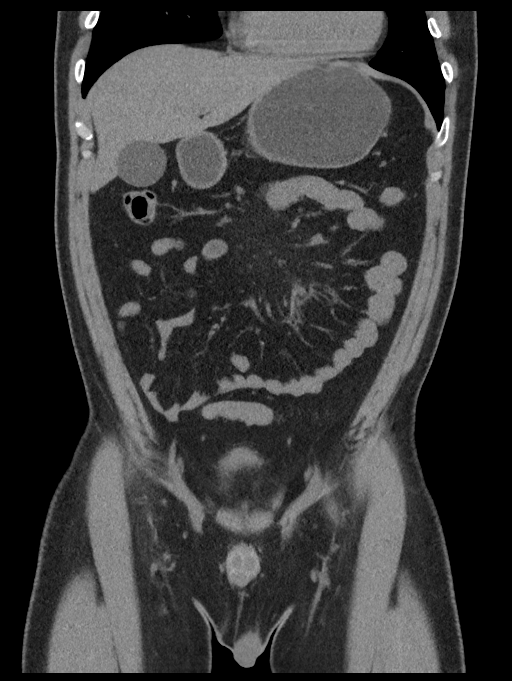
[im 41/92  soft-tissue]
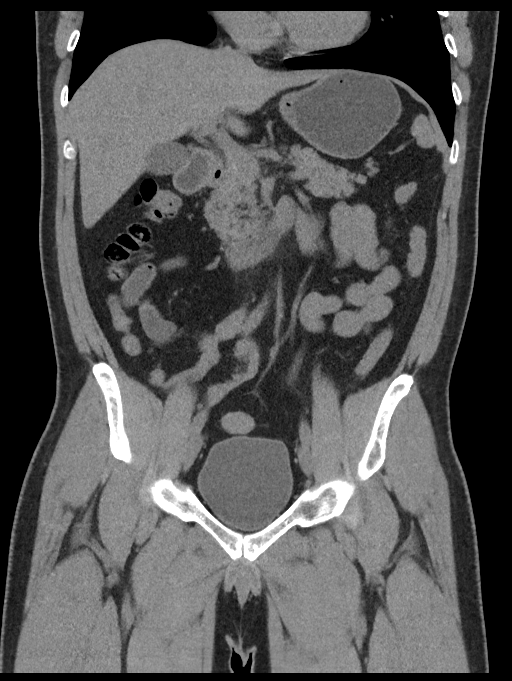
[im 51/92  soft-tissue]
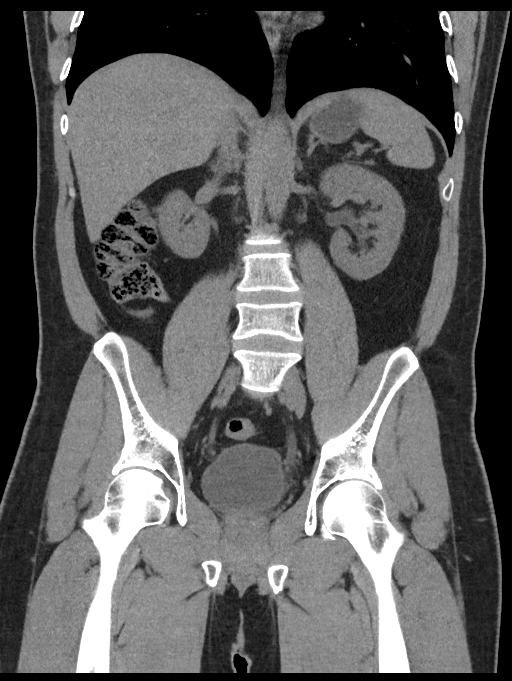

[16 of 46 positions shown; findings below may reference images not displayed]

FINDINGS: Lower chest: Lung bases are clear.

Hepatobiliary: No focal hepatic lesion. No biliary duct dilatation.
Gallbladder is normal. Common bile duct is normal.

Pancreas: Pancreas is normal. No ductal dilatation. No pancreatic
inflammation.

Spleen: Normal spleen

Adrenals/urinary tract: Adrenal glands are normal. Mild LEFT
pelvicaliectasis and LEFT hydroureter. There is an obstructing
calculus in the distal LEFT ureter at the vesicoureteral junction
measuring 4 mm on image 73/2.

There are 4 additional nonobstructing calculi within the LEFT kidney
ranging size from 2-4 mm.

Five calculi in the RIGHT kidney ranging size from 1-6 mm. No RIGHT
hydronephrosis or hydroureter.

There is a nonobstructing calculus in the distal RIGHT ureter
measuring 4 mm (image 67/2). This distal RIGHT ureteral calculus is
approximately 2 cm from the RIGHT vesicoureteral junction.

No bladder calculi.

Stomach/Bowel: Stomach, small bowel, appendix, and cecum are normal.
The colon and rectosigmoid colon are normal.

Vascular/Lymphatic: Abdominal aorta is normal caliber. No periportal
or retroperitoneal adenopathy. No pelvic adenopathy.

Reproductive: Prostate unremarkable

Other: No free fluid.

Musculoskeletal: Is
IMPRESSION: 1. Obstructing calculus in the distal LEFT ureter at the LEFT
vesicoureteral junction.
2. Nonobstructing calculus in the distal RIGHT ureter several cm
from the RIGHT vesicoureteral junction.
3. Bilateral nephrolithiasis.

## 2021-08-21 DIAGNOSIS — Z87442 Personal history of urinary calculi: Secondary | ICD-10-CM | POA: Diagnosis not present

## 2021-10-02 DIAGNOSIS — N200 Calculus of kidney: Secondary | ICD-10-CM | POA: Diagnosis not present

## 2021-12-04 ENCOUNTER — Telehealth: Payer: Self-pay

## 2021-12-04 ENCOUNTER — Ambulatory Visit (INDEPENDENT_AMBULATORY_CARE_PROVIDER_SITE_OTHER): Payer: BLUE CROSS/BLUE SHIELD | Admitting: Family Medicine

## 2021-12-04 ENCOUNTER — Encounter: Payer: Self-pay | Admitting: Family Medicine

## 2021-12-04 VITALS — BP 112/80 | HR 77 | Temp 98.3°F | Ht 66.0 in | Wt 171.1 lb

## 2021-12-04 DIAGNOSIS — R079 Chest pain, unspecified: Secondary | ICD-10-CM

## 2021-12-04 NOTE — Telephone Encounter (Signed)
Nurse Assessment Nurse: Stefano Gaul, RN, Dwana Curd Date/Time (Eastern Time): 12/04/2021 11:17:11 AM Confirm and document reason for call. If symptomatic, describe symptoms. ---Caller states he is having left sided chest pain. pain is intermittent. does not last very long just a few seconds. has had pain x 2 days. having pain every 5-10 min. Had pain while I was talking to him Does the patient have any new or worsening symptoms? ---Yes Will a triage be completed? ---Yes Related visit to physician within the last 2 weeks? ---No Does the PT have any chronic conditions? (i.e. diabetes, asthma, this includes High risk factors for pregnancy, etc.) ---No Is this a behavioral health or substance abuse call? ---No Guidelines Guideline Title Affirmed Question Affirmed Notes Nurse Date/Time Lamount Cohen Time) Chest Pain [1] Chest pain lasts < 5 minutes AND [2] NO chest pain or cardiac symptoms (e.g., breathing difficulty, sweating) now (Exception: Chest pains that last only a few seconds.) Stefano Gaul, RN, Dwana Curd 12/04/2021 11:19:22 AM PLEASE NOTE: All timestamps contained within this report are represented as [place] Standard Time. CONFIDENTIALTY NOTICE: This fax transmission is intended only for the addressee. It contains information that is legally privileged, confidential or otherwise protected from use or disclosure. If you are not the intended recipient, you are strictly prohibited from reviewing, disclosing, copying using or disseminating any of this information or taking any action in reliance on or regarding this information. If you have received this fax in error, please notify us immediately by telephone so that we can arrange for its return to [place]. Phone: [phone], Toll-Free: [phone], Fax: [phone] Page: 2 of 2 Call Id: [id number] Disp. Time Lamount Cohen Time) Disposition Final User 12/04/2021 11:15:21 AM Send to Urgent Queue Tilda Franco 12/04/2021 11:29:38 AM Go to ED Now (or PCP triage)  Yes Stefano Gaul, RN, Dwana Curd Final Disposition 12/04/2021 11:29:38 AM Go to ED Now (or PCP triage) Yes Stefano Gaul, RN, Dwana Curd Disposition Overriden: See PCP within 24 Hours Override Reason: Patient's symptoms need a higher level of care Caller Disagree/Comply Disagree Caller Understands Yes PreDisposition Call Doctor Care Advice Given Per Guideline GO TO ED NOW (OR PCP TRIAGE): * IF NO PCP (PRIMARY CARE PROVIDER) SECOND-LEVEL TRIAGE: You need to be seen within the next hour. Go to the ED/UCC at _____________ Hospital. Leave as soon as you can. Comments User: Art Buff, RN Date/Time Lamount Cohen Time): 12/04/2021 11:27:27 AM pt has appt next Tuesday. User: Art Buff, RN Date/Time Lamount Cohen Time): 12/04/2021 11:32:26 AM triage outcome upgraded to go to ER now or PCP triage) as pt is having chest pain every 5-10 min. States he does not want to go to urgent care or the ER. Called back line and spoke to [place] and gave report that pt is having chest pain every 5-10 min. Triage outcome go to ER now or PCP triage. [place] someone will call pt back Referrals GO TO FACILITY REFUSED

## 2021-12-04 NOTE — Telephone Encounter (Signed)
Called pt and made appt for today at 4:15 for chest pain

## 2021-12-04 NOTE — Patient Instructions (Addendum)
You don't have any sinister findings on exam or concerning factors in your history.   EKG is normal.  OK to take Tylenol 1000 mg (2 extra strength tabs) or 975 mg (3 regular strength tabs) every 6 hours as needed.  Ibuprofen 400-600 mg (2-3 over the counter strength tabs) every 6 hours as needed for pain.  Heat (pad or rice pillow in microwave) over affected area, 10-15 minutes twice daily.   Ice/cold pack over area for 10-15 min twice daily.  Let us know if you need anything.  Pectoralis Major Rehab Ask your health care provider which exercises are safe for you. Do exercises exactly as told by your health care provider and adjust them as directed. It is normal to feel mild stretching, pulling, tightness, or discomfort as you do these exercises, but you should stop right away if you feel sudden pain or your pain gets worse. Do not begin these exercises until told by your health care provider. Stretching and range of motion exercises These exercises warm up your muscles and joints and improve the movement and flexibility of your shoulder. These exercises can also help to relieve pain, numbness, and tingling. Exercise A: Pendulum  Stand near a wall or a surface that you can hold onto for balance. Bend at the waist and let your left / right arm hang straight down. Use your other arm to keep your balance. Relax your arm and shoulder muscles, and move your hips and your trunk so your left / right arm swings freely. Your arm should swing because of the motion of your body, not because you are using your arm or shoulder muscles. Keep moving so your arm swings in the following directions, as told by your health care provider: Side to side. Forward and backward. In clockwise and counterclockwise circles. Slowly return to the starting position. Repeat 2 times. Complete this exercise 3 times per week. Exercise B: Abduction, standing Stand and hold a broomstick, a cane, or a similar object. Place  your hands a little more than shoulder-width apart on the object. Your left / right hand should be palm-up, and your other hand should be palm-down. While keeping your elbow straight and your shoulder muscles relaxed, push the stick across your body toward your left / right side. Raise your left / right arm to the side of your body and then over your head until you feel a stretch in your shoulder. Stop when you reach the angle that is recommended by your health care provider. Avoid shrugging your shoulder while you raise your arm. Keep your shoulder blade tucked down toward the middle of your spine. Hold for 10 seconds. Slowly return to the starting position. Repeat 2 times. Complete this exercise 3 times per week. Exercise C: Wand flexion, supine  Lie on your back. You may bend your knees for comfort. Hold a broomstick, a cane, or a similar object so that your hands are about shoulder-width apart on the object. Your palms should face toward your feet. Raise your left / right arm in front of your face, then behind your head (toward the floor). Use your other hand to help you do this. Stop when you feel a gentle stretch in your shoulder, or when you reach the angle that is recommended by your health care provider. Hold for 3 seconds. Use the broomstick and your other arm to help you return your left / right arm to the starting position. Repeat 2 times. Complete this exercise 3 times per  week. Exercise D: Wand shoulder external rotation Stand and hold a broomstick, a cane, or a similar object so your hands are about shoulder-width apart on the object. Start with your arms hanging down, then bend both elbows to an "L" shape (90 degrees). Keep your left / right elbow at your side. Use your other hand to push the stick so your left / right forearm moves away from your body, out to your side. Keep your left / right elbow bent to 90 degrees and keep it against your side. Stop when you feel a gentle  stretch in your shoulder, or when you reach the angle recommended by your health care provider. Hold for 10 seconds. Use the stick to help you return your left / right arm to the starting position. Repeat 2 times. Complete this exercise 3 times per week. Strengthening exercises These exercises build strength and endurance in your shoulder. Endurance is the ability to use your muscles for a long time, even after your muscles get tired. Exercise E: Scapular protraction, standing Stand so you are facing a wall. Place your feet about one arm-length away from the wall. Place your hands on the wall and straighten your elbows. Keep your hands on the wall as you push your upper back away from the wall. You should feel your shoulder blades sliding forward. Keep your elbows and your head still. If you are not sure that you are doing this exercise correctly, ask your health care provider for more instructions. Hold for 3 seconds. Slowly return to the starting position. Let your muscles relax completely before you repeat this exercise. Repeat 2 times. Complete this exercise 3 times per week. Exercise F: Shoulder blade squeezes  (scapular retraction) Sit with good posture in a stable chair. Do not let your back touch the back of the chair. Your arms should be at your sides with your elbows bent. You may rest your forearms on a pillow if that is more comfortable. Squeeze your shoulder blades together. Bring them down and back. Keep your shoulders level. Do not lift your shoulders up toward your ears. Hold for 3 seconds. Return to the starting position. Repeat 2 times. Complete this exercise 3 times per week. This information is not intended to replace advice given to you by your health care provider. Make sure you discuss any questions you have with your health care provider. Document Released: 03/15/2005 Document Revised: 12/25/2015 Document Reviewed: 12/01/2014 Elsevier Interactive Patient Education   [employer].

## 2021-12-04 NOTE — Progress Notes (Signed)
Chief Complaint  Patient presents with   Chest Pain    For 2 days     Devin Bauer is a 41 y.o. male here for evaluation of  L lower chest pain.  Duration of issue: 2 days Quality: sharp Palliation: none Provocation: none Severity: 3/10 Radiation: none Duration of chest pain: 1  second Associated symptoms: No rashes, jaw pain, arm pain, SOB. Not exertional, no a/w meals or position.  No inj or change in activity.  Cardiac history: hyperlipidemia Family heart history: none No illicit drug use.  Smoker? No Has not tried anything at home thus far.   Past Medical History:  Diagnosis Date   Hyperlipidemia    Kidney stone    Family History  Problem Relation Age of Onset   Hypertension Father     BP 112/80   Pulse 77   Temp 98.3 F (36.8 C) (Oral)   Ht 5\' 6"  (1.676 m)   Wt 171 lb 2 oz (77.6 kg)   SpO2 98%   BMI 27.62 kg/m  Gen: awake, alert, appears stated age HEENT: PERRLA, MMM Neck: No masses or asymmetry Heart: RRR, no bruits, no LE edema Lungs: CTAB, no accessory muscle use Abd: Soft, NT, ND, no masses or organomegaly MSK: chest pain is not reproducible to palptation Psych: Age appropriate judgment and insight, nml mood and affect  Chest pain, unspecified type - Plan: EKG 12-Lead  Atypical. No risk factors for PE or coronary disease. EKG shows NSR, normal axis, no interval abnormalities, no ST segment or T wave changes, good R wave progression. NSAIDs, Tylenol, heat, stretches/exercises.  F/u prn. The patient voiced understanding and agreement to the plan.  I spent 30 min w the patient discussing various etiologies of chest pain and discussing the above plan in addition to reviewing his chart on the same day of the visit.   [place], DO 12/04/21 4:41 PM

## 2021-12-08 ENCOUNTER — Ambulatory Visit: Payer: BLUE CROSS/BLUE SHIELD | Admitting: Family Medicine

## 2022-03-24 ENCOUNTER — Ambulatory Visit (INDEPENDENT_AMBULATORY_CARE_PROVIDER_SITE_OTHER): Payer: BLUE CROSS/BLUE SHIELD | Admitting: Family Medicine

## 2022-03-24 ENCOUNTER — Encounter: Payer: Self-pay | Admitting: Family Medicine

## 2022-03-24 VITALS — BP 138/97 | HR 96 | Temp 98.2°F | Resp 16 | Ht 66.0 in | Wt 174.0 lb

## 2022-03-24 DIAGNOSIS — J069 Acute upper respiratory infection, unspecified: Secondary | ICD-10-CM

## 2022-03-24 DIAGNOSIS — H1033 Unspecified acute conjunctivitis, bilateral: Secondary | ICD-10-CM | POA: Diagnosis not present

## 2022-03-24 MED ORDER — POLYMYXIN B-TRIMETHOPRIM 10000-0.1 UNIT/ML-% OP SOLN: 1.0000 [drp] | OPHTHALMIC | 0 refills | Status: DC

## 2022-03-24 MED ORDER — PROMETHAZINE-DM 6.25-15 MG/5ML PO SYRP: 5.0000 mL | ORAL_SOLUTION | Freq: Four times a day (QID) | ORAL | 0 refills | Status: DC | PRN

## 2022-03-24 NOTE — Patient Instructions (Addendum)
Continue to push fluids, practice good hand hygiene, and cover your mouth if you cough.  If you start having fevers, shaking or shortness of breath, seek immediate care.  OK to take Tylenol 1000 mg (2 extra strength tabs) or 975 mg (3 regular strength tabs) every 6 hours as needed.  Artificial tears like Refresh and Systane may be used for comfort. OK to get generic version. Generally people use them every 2-4 hours, but you can use them as much as you want because there is no medication in it.  Warm compresses can be helpful as well.   Try not to touch your face.   Let us know if you need anything.

## 2022-03-24 NOTE — Progress Notes (Addendum)
Chief Complaint  Patient presents with   Eye Drainage    Complains of eye drainage and discomfort   Sore Throat    Complains of having sore throat for 3 days   Cough    Complains of cough that started this morning   Generalized Body Aches    Complains of body aches for the past 2 days better today    Georgeanna Lea here for URI complaints.  Duration: 3 days  Associated symptoms: sinus congestion, itchy watery eyes, drainage from eyes, ear fullness, sore throat, myalgia, and coughing Denies: sinus pain, rhinorrhea, ear pain, ear drainage, wheezing, shortness of breath, and fevers Treatment to date: ibuprofen Sick contacts: Yes; son  Past Medical History:  Diagnosis Date   Hyperlipidemia    Kidney stone     Objective BP (!) 138/97 (BP Location: Right Arm, Patient Position: Sitting, Cuff Size: Small)   Pulse 96   Temp 98.2 F (36.8 C) (Oral)   Resp 16   Ht 5\' 6"  (1.676 m)   Wt 174 lb (78.9 kg)   SpO2 100%   BMI 28.08 kg/m  General: Awake, alert, appears stated age HEENT: AT, [place], ears patent b/l and TM's neg, nares patent w/o discharge, pharynx pink and without exudates, MMM; eyes not injected but medial yellowish dc noted b/l Neck: No masses or asymmetry Heart: RRR Lungs: CTAB, no accessory muscle use Psych: Age appropriate judgment and insight, normal mood and affect  Acute conjunctivitis of both eyes, unspecified acute conjunctivitis type - Plan: trimethoprim-polymyxin b (POLYTRIM) ophthalmic solution  Viral URI with cough - Plan: promethazine-dextromethorphan (PROMETHAZINE-DM) 6.25-15 MG/5ML syrup  Try not to touch face. Art tears and warm compresses rec'd.  Cough syrup prn. Warnings about drowsiness verbalized. Continue to push fluids, practice good hand hygiene, cover mouth when coughing. F/u prn. If starting to experience fevers, shaking, or shortness of breath, seek immediate care. Pt voiced understanding and agreement to the plan.  05-31-1988 [place],  DO 03/24/22 8:52 AM

## 2022-03-29 ENCOUNTER — Encounter: Payer: Self-pay | Admitting: Family Medicine

## 2022-03-30 ENCOUNTER — Other Ambulatory Visit: Payer: Self-pay | Admitting: Family Medicine

## 2022-03-30 MED ORDER — DOXYCYCLINE HYCLATE 100 MG PO TABS: 100.0000 mg | ORAL_TABLET | Freq: Two times a day (BID) | ORAL | 0 refills | Status: DC

## 2022-03-30 MED ORDER — DOXYCYCLINE HYCLATE 100 MG PO TABS: 100.0000 mg | ORAL_TABLET | Freq: Two times a day (BID) | ORAL | 0 refills | Status: AC

## 2022-04-26 DIAGNOSIS — N200 Calculus of kidney: Secondary | ICD-10-CM | POA: Diagnosis not present

## 2022-06-03 DIAGNOSIS — N200 Calculus of kidney: Secondary | ICD-10-CM | POA: Diagnosis not present

## 2022-06-04 DIAGNOSIS — N200 Calculus of kidney: Secondary | ICD-10-CM | POA: Diagnosis not present

## 2022-07-12 ENCOUNTER — Encounter: Payer: Self-pay | Admitting: *Deleted

## 2022-09-22 ENCOUNTER — Ambulatory Visit: Payer: BLUE CROSS/BLUE SHIELD | Admitting: Family Medicine

## 2022-09-24 ENCOUNTER — Encounter: Payer: Self-pay | Admitting: Family Medicine

## 2022-09-24 ENCOUNTER — Ambulatory Visit (INDEPENDENT_AMBULATORY_CARE_PROVIDER_SITE_OTHER): Payer: BLUE CROSS/BLUE SHIELD | Admitting: Family Medicine

## 2022-09-24 VITALS — BP 128/84 | HR 72 | Temp 97.6°F | Ht 66.0 in | Wt 167.0 lb

## 2022-09-24 DIAGNOSIS — Z0000 Encounter for general adult medical examination without abnormal findings: Secondary | ICD-10-CM

## 2022-09-24 DIAGNOSIS — M25561 Pain in right knee: Secondary | ICD-10-CM | POA: Diagnosis not present

## 2022-09-24 DIAGNOSIS — M722 Plantar fascial fibromatosis: Secondary | ICD-10-CM | POA: Diagnosis not present

## 2022-09-24 DIAGNOSIS — M25562 Pain in left knee: Secondary | ICD-10-CM

## 2022-09-24 LAB — COMPREHENSIVE METABOLIC PANEL
ALT: 40 U/L (ref 0–53)
AST: 30 U/L (ref 0–37)
Albumin: 4.9 g/dL (ref 3.5–5.2)
Alkaline Phosphatase: 66 U/L (ref 39–117)
BUN: 8 mg/dL (ref 6–23)
CO2: 30 mEq/L (ref 19–32)
Calcium: 9.8 mg/dL (ref 8.4–10.5)
Chloride: 101 mEq/L (ref 96–112)
Creatinine, Ser: 1.09 mg/dL (ref 0.40–1.50)
GFR: 84.98 mL/min (ref >60.00)
Glucose, Bld: 88 mg/dL (ref 70–99)
Potassium: 4.3 mEq/L (ref 3.5–5.1)
Sodium: 137 mEq/L (ref 135–145)
Total Bilirubin: 0.7 mg/dL (ref 0.2–1.2)
Total Protein: 7.5 g/dL (ref 6.0–8.3)

## 2022-09-24 LAB — LIPID PANEL
Cholesterol: 103 mg/dL (ref 0–200)
HDL: 26.3 mg/dL — ABNORMAL LOW (ref >39.00)
LDL Cholesterol: 49 mg/dL (ref 0–99)
NonHDL: 76.21
Total CHOL/HDL Ratio: 4
Triglycerides: 134 mg/dL (ref 0.0–149.0)
VLDL: 26.8 mg/dL (ref 0.0–40.0)

## 2022-09-24 LAB — CBC
HCT: 42.6 % (ref 39.0–52.0)
Hemoglobin: 14.2 g/dL (ref 13.0–17.0)
MCHC: 33.2 g/dL (ref 30.0–36.0)
MCV: 88.9 fl (ref 78.0–100.0)
Platelets: 261 10*3/uL (ref 150.0–400.0)
RBC: 4.79 Mil/uL (ref 4.22–5.81)
RDW: 13.1 % (ref 11.5–15.5)
WBC: 5.2 10*3/uL (ref 4.0–10.5)

## 2022-09-24 NOTE — Progress Notes (Signed)
Chief Complaint  Patient presents with   Knee Pain    labs    Well Male Devin Bauer is here for a complete physical.   His last physical was >1 year ago.  Current diet: in general, a "pretty good" diet.   Current exercise: yoga, walking Weight trend: stable Fatigue out of ordinary? No. Seat belt? Yes.   Advanced directive? No  Health maintenance Tetanus- Yes HIV- Yes Hep C- Yes  Knee pressure B/l knee pain/grinding for 1 mo. No inj or change in activity. Not improving. No neuro s/s's, redness, swelling, bruising, catching/locking. Has not tried anything. Has had some grinding going down stairs. Tried cold packs at home.    Heel pain B/l pain worse in AM, around  1 mo ago. No in or change in activity. No change in footwear. No bruising, redness, swelling. Has not tried anything at home for this.   Past Medical History:  Diagnosis Date   Hyperlipidemia    Kidney stone     Past Surgical History:  Procedure Laterality Date   KIDNEY STONE SURGERY     Medications  Takes no medications routinely.   Allergies No Known Allergies  Family History Family History  Problem Relation Age of Onset   Hypertension Father     Review of Systems: Constitutional: no fevers or chills Eye:  no recent significant change in vision Ear/Nose/Mouth/Throat:  Ears:  no hearing loss Nose/Mouth/Throat:  no complaints of nasal congestion, no sore throat Cardiovascular:  no chest pain Respiratory:  no shortness of breath Gastrointestinal:  no abdominal pain, no change in bowel habits GU:  Male: negative for dysuria, frequency, and incontinence Musculoskeletal/Extremities:  + Heel and knee pain as above Integumentary (Skin/Breast):  no abnormal skin lesions reported Neurologic:  no headaches Endocrine: No unexpected weight changes Hematologic/Lymphatic:  no night sweats  Exam BP 128/84 (BP Location: Left Arm, Cuff Size: Normal)   Pulse 72   Temp 97.6 F (36.4 C) (Oral)   Ht 5\' 6"   (1.676 m)   Wt 167 lb (75.8 kg)   SpO2 99%   BMI 26.95 kg/m  General:  well developed, well nourished, in no apparent distress Skin:  no significant moles, warts, or growths Head:  no masses, lesions, or tenderness Eyes:  pupils equal and round, sclera anicteric without injection Ears:  canals without lesions, TMs shiny without retraction, no obvious effusion, no erythema Nose:  nares patent, mucosa normal Throat/Pharynx:  lips and gingiva without lesion; tongue and uvula midline; non-inflamed pharynx; no exudates or postnasal drainage Neck: neck supple without adenopathy, thyromegaly, or masses Lungs:  clear to auscultation, breath sounds equal bilaterally, no respiratory distress Cardio:  regular rate and rhythm, no bruits, no LE edema Abdomen:  abdomen soft, nontender; bowel sounds normal; no masses or organomegaly Rectal: Deferred Musculoskeletal:   Feet: No deformity, edema, ecchymosis, or erythema.  Mild TTP over the proximal insertion of the plantar fascia on the right, no significant TTP on the left.  Arches are flat. Knees: Normal active/passive range of motion.  Crepitus noticed with extension on the left.  No TTP, deformity, ecchymosis, edema, effusion, or erythema.  Negative patellar apprehension/grind, Lachman's, Stines, varus/valgus stress bilaterally. Extremities:  no clubbing, cyanosis, or edema, no deformities, no skin discoloration Neuro:  gait normal; deep tendon reflexes normal and symmetric Psych: well oriented with normal range of affect and appropriate judgment/insight  Assessment and Plan  Well adult exam - Plan: CBC, Comprehensive metabolic panel, Lipid panel  Bilateral plantar fasciitis  Acute pain of both knees   Well 42 y.o. male. Counseled on diet and exercise. Advanced directive form provided today.  Plantar fasciitis: [place] sock, stretches/exercises, ice, Tylenol, will consider physical therapy versus injection if no improvement. Bilateral knee  pain: Suspect patellofemoral syndrome given crepitus.  He is not having any weakness or consistent pain.  Stretches and exercises provided.  Physical therapy versus sports medicine referral if no improvement. Other orders as above. Follow up in 1 yr pending the above workup. The patient voiced understanding and agreement to the plan.  Jilda Roche [place], DO 09/24/22 8:39 AM

## 2022-09-24 NOTE — Patient Instructions (Addendum)
Give [place] 2-3 business days to get the results of your labs back.   Keep the diet clean and stay active.  Please get me a copy of your advanced directive form at your convenience.   Heat (pad or rice pillow in microwave) over affected area, 10-15 minutes twice daily.   Ice/cold pack over area for 10-15 min twice daily.  OK to take Tylenol 1000 mg (2 extra strength tabs) or 975 mg (3 regular strength tabs) every 6 hours as needed.  Consider a [place] sock at night.  Send me a message in 1 mo if no better.   Let us know if you need anything.  Knee Exercises It is normal to feel mild stretching, pulling, tightness, or discomfort as you do these exercises, but you should stop right away if you feel sudden pain or your pain gets worse.  STRETCHING AND RANGE OF MOTION EXERCISES  These exercises warm up your muscles and joints and improve the movement and flexibility of your knee. These exercises also help to relieve pain, numbness, and tingling. Exercise A: Knee Extension, Prone  Lie on your abdomen on a bed. Place your left / right knee just beyond the edge of the surface so your knee is not on the bed. You can put a towel under your left / right thigh just above your knee for comfort. Relax your leg muscles and allow gravity to straighten your knee. You should feel a stretch behind your left / right knee. Hold this position for 30 seconds. Scoot up so your knee is supported between repetitions. Repeat 2 times. Complete this stretch 3 times per week. Exercise B: Knee Flexion, Active     Lie on your back with both knees straight. If this causes back discomfort, bend your left / right knee so your foot is flat on the floor. Slowly slide your left / right heel back toward your buttocks until you feel a gentle stretch in the front of your knee or thigh. Hold this position for 30 seconds. Slowly slide your left / right heel back to the starting position. Repeat 2 times. Complete this  exercise 3 times per week. Exercise C: Quadriceps, Prone     Lie on your abdomen on a firm surface, such as a bed or padded floor. Bend your left / right knee and hold your ankle. If you cannot reach your ankle or pant leg, loop a belt around your foot and grab the belt instead. Gently pull your heel toward your buttocks. Your knee should not slide out to the side. You should feel a stretch in the front of your thigh and knee. Hold this position for 30 seconds. Repeat 2 times. Complete this stretch 3 times per week. Exercise D: Hamstring, Supine  Lie on your back. Loop a belt or towel over the ball of your left / right foot. The ball of your foot is on the walking surface, right under your toes. Straighten your left / right knee and slowly pull on the belt to raise your leg until you feel a gentle stretch behind your knee. Do not let your left / right knee bend while you do this. Keep your other leg flat on the floor. Hold this position for 30 seconds. Repeat 2 times. Complete this stretch 3 times per week. STRENGTHENING EXERCISES  These exercises build strength and endurance in your knee. Endurance is the ability to use your muscles for a long time, even after they get tired. Exercise E: Quadriceps,  Isometric     Lie on your back with your left / right leg extended and your other knee bent. Put a rolled towel or small pillow under your knee if told by your health care provider. Slowly tense the muscles in the front of your left / right thigh. You should see your kneecap slide up toward your hip or see increased dimpling just above the knee. This motion will push the back of the knee toward the floor. For 3 seconds, keep the muscle as tight as you can without increasing your pain. Relax the muscles slowly and completely. Repeat for 10 total reps Repeat 2 ti mes. Complete this exercise 3 times per week. Exercise F: Straight Leg Raises - Quadriceps  Lie on your back with your left / right  leg extended and your other knee bent. Tense the muscles in the front of your left / right thigh. You should see your kneecap slide up or see increased dimpling just above the knee. Your thigh may even shake a bit. Keep these muscles tight as you raise your leg 4-6 inches (10-15 cm) off the floor. Do not let your knee bend. Hold this position for 3 seconds. Keep these muscles tense as you lower your leg. Relax your muscles slowly and completely after each repetition. 10 total reps. Repeat 2 times. Complete this exercise 3 times per week.  Exercise G: Hamstring Curls     If told by your health care provider, do this exercise while wearing ankle weights. Begin with 5 lb weights (optional). Then increase the weight by 1 lb (0.5 kg) increments. Do not wear ankle weights that are more than 20 lbs to start with. Lie on your abdomen with your legs straight. Bend your left / right knee as far as you can without feeling pain. Keep your hips flat against the floor. Hold this position for 3 seconds. Slowly lower your leg to the starting position. Repeat for 10 reps.  Repeat 2 times. Complete this exercise 3 times per week. Exercise H: Squats (Quadriceps)  Stand in front of a table, with your feet and knees pointing straight ahead. You may rest your hands on the table for balance but not for support. Slowly bend your knees and lower your hips like you are going to sit in a chair. Keep your weight over your heels, not over your toes. Keep your lower legs upright so they are parallel with the table legs. Do not let your hips go lower than your knees. Do not bend lower than told by your health care provider. If your knee pain increases, do not bend as low. Hold the squat position for 1 second. Slowly push with your legs to return to standing. Do not use your hands to pull yourself to standing. Repeat 2 times. Complete this exercise 3 times per week. Exercise I: Wall Slides (Quadriceps)     Lean your  back against a smooth wall or door while you walk your feet out 18-24 inches (46-61 cm) from it. Place your feet hip-width apart. Slowly slide down the wall or door until your knees Repeat 2 times. Complete this exercise every other day. Exercise K: Straight Leg Raises - Hip Abductors  Lie on your side with your left / right leg in the top position. Lie so your head, shoulder, knee, and hip line up. You may bend your bottom knee to help you keep your balance. Roll your hips slightly forward so your hips are stacked directly over each  other and your left / right knee is facing forward. Leading with your heel, lift your top leg 4-6 inches (10-15 cm). You should feel the muscles in your outer hip lifting. Do not let your foot drift forward. Do not let your knee roll toward the ceiling. Hold this position for 3 seconds. Slowly return your leg to the starting position. Let your muscles relax completely after each repetition. 10 total reps. Repeat 2 times. Complete this exercise 3 times per week. Exercise J: Straight Leg Raises - Hip Extensors  Lie on your abdomen on a firm surface. You can put a pillow under your hips if that is more comfortable. Tense the muscles in your buttocks and lift your left / right leg about 4-6 inches (10-15 cm). Keep your knee straight as you lift your leg. Hold this position for 3 seconds. Slowly lower your leg to the starting position. Let your leg relax completely after each repetition. Repeat 2 times. Complete this exercise 3 times per week. Document Released: 01/27/2005 Document Revised: 12/08/2015 Document Reviewed: 01/19/2015 Elsevier Interactive Patient Education  2017 [employer]  Plantar Fasciitis Stretches/exercises Do exercises exactly as told by your health care provider and adjust them as directed. It is normal to feel mild stretching, pulling, tightness, or discomfort as you do these exercises, but you should stop right away if you feel sudden pain or  your pain gets worse.   Stretching and range of motion exercises These exercises warm up your muscles and joints and improve the movement and flexibility of your foot. These exercises also help to relieve pain.  Exercise A: Plantar fascia stretch Sit with your left / right leg crossed over your opposite knee. Hold your heel with one hand with that thumb near your arch. With your other hand, hold your toes and gently pull them back toward the top of your foot. You should feel a stretch on the bottom of your toes or your foot or both. Hold this stretch for 30 seconds. Slowly release your toes and return to the starting position. Repeat 2 times. Complete this exercise 3 times per week.  Exercise B: Gastroc, standing Stand with your hands against a wall. Extend your left / right leg behind you, and bend your front knee slightly. Keeping your heels on the floor and keeping your back knee straight, shift your weight toward the wall without arching your back. You should feel a gentle stretch in your left / right calf. Hold this position for 30 seconds. Repeat 2 times. Complete this exercise 3 times a week. Exercise C: Soleus, standing Stand with your hands against a wall. Extend your left / right leg behind you, and bend your front knee slightly. Keeping your heels on the floor, bend your back knee and slightly shift your weight over the back leg. You should feel a gentle stretch deep in your calf. Hold this position for 30 seconds. Repeat 2 times. Complete this exercise 3 times per week. Exercise D: Gastrocsoleus, standing Stand with the ball of your left / right foot on a step. The ball of your foot is on the walking surface, right under your toes. Keep your other foot firmly on the same step. Hold onto the wall or a railing for balance. Slowly lift your other foot, allowing your body weight to press your heel down over the edge of the step. You should feel a stretch in your left / right  calf. Hold this position for 30 seconds. Return both feet to  the step. Repeat this exercise with a slight bend in your left / right knee. Repeat 2 times with your left / right knee straight and 2times with your left / right knee bent. Complete this exercise 3 times a week.  Balance exercise This exercise builds your balance and strength control of your arch to help take pressure off your plantar fascia. Exercise E: Single leg stand Without shoes, stand near a railing or in a doorway. You may hold onto the railing or door frame as needed. Stand on your left / right foot. Keep your big toe down on the floor and try to keep your arch lifted. Do not let your foot roll inward. Hold this position for 30 seconds. If this exercise is too easy, you can try it with your eyes closed or while standing on a pillow. Repeat 2 times. Complete this exercise 3 times per week. This information is not intended to replace advice given to you by your health care provider. Make sure you discuss any questions you have with your health care provider. Document Released: 03/15/2005 Document Revised: 11/18/2015 Document Reviewed: 01/27/2015 Elsevier Interactive Patient Education  2017 [employer].

## 2023-12-02 ENCOUNTER — Ambulatory Visit: Payer: Self-pay | Admitting: Family Medicine

## 2023-12-02 ENCOUNTER — Encounter: Payer: Self-pay | Admitting: Family Medicine

## 2023-12-02 ENCOUNTER — Ambulatory Visit (INDEPENDENT_AMBULATORY_CARE_PROVIDER_SITE_OTHER): Admitting: Family Medicine

## 2023-12-02 VITALS — BP 130/82 | HR 70 | Temp 98.1°F | Resp 18 | Ht 66.0 in | Wt 165.2 lb

## 2023-12-02 DIAGNOSIS — Z0000 Encounter for general adult medical examination without abnormal findings: Secondary | ICD-10-CM | POA: Diagnosis not present

## 2023-12-02 DIAGNOSIS — N200 Calculus of kidney: Secondary | ICD-10-CM

## 2023-12-02 DIAGNOSIS — M6289 Other specified disorders of muscle: Secondary | ICD-10-CM | POA: Diagnosis not present

## 2023-12-02 LAB — CBC
HCT: 42.6 % (ref 39.0–52.0)
Hemoglobin: 14.1 g/dL (ref 13.0–17.0)
MCHC: 33.1 g/dL (ref 30.0–36.0)
MCV: 88.4 fl (ref 78.0–100.0)
Platelets: 237 K/uL (ref 150.0–400.0)
RBC: 4.81 Mil/uL (ref 4.22–5.81)
RDW: 12.9 % (ref 11.5–15.5)
WBC: 4.7 K/uL (ref 4.0–10.5)

## 2023-12-02 LAB — COMPREHENSIVE METABOLIC PANEL WITH GFR
ALT: 39 U/L (ref 0–53)
AST: 27 U/L (ref 0–37)
Albumin: 4.9 g/dL (ref 3.5–5.2)
Alkaline Phosphatase: 65 U/L (ref 39–117)
BUN: 9 mg/dL (ref 6–23)
CO2: 30 meq/L (ref 19–32)
Calcium: 9.7 mg/dL (ref 8.4–10.5)
Chloride: 101 meq/L (ref 96–112)
Creatinine, Ser: 1.2 mg/dL (ref 0.40–1.50)
GFR: 75.09 mL/min (ref >60.00)
Glucose, Bld: 88 mg/dL (ref 70–99)
Potassium: 4.5 meq/L (ref 3.5–5.1)
Sodium: 140 meq/L (ref 135–145)
Total Bilirubin: 1 mg/dL (ref 0.2–1.2)
Total Protein: 7.8 g/dL (ref 6.0–8.3)

## 2023-12-02 LAB — LIPID PANEL
Cholesterol: 169 mg/dL (ref 0–200)
HDL: 32.2 mg/dL — ABNORMAL LOW (ref >39.00)
LDL Cholesterol: 96 mg/dL (ref 0–99)
NonHDL: 136.43
Total CHOL/HDL Ratio: 5
Triglycerides: 200 mg/dL — ABNORMAL HIGH (ref 0.0–149.0)
VLDL: 40 mg/dL (ref 0.0–40.0)

## 2023-12-02 NOTE — Progress Notes (Signed)
 Chief Complaint  Patient presents with   Annual Exam    Well Male Devin Bauer is here for a complete physical.   His last physical was >1 year ago.  Current diet: in general, a healthy diet.   Current exercise: yoga Weight trend: stable Fatigue out of ordinary? No. Seat belt? Yes.   Advanced directive? Yes  Health maintenance Tetanus- Yes HIV- Yes Hep C- Yes  Over the past 2 weeks, the patient notices when he sits down for 30 minutes or more, he will have pain over his hamstrings near his buttock area.  He will also have a burning/pinching sensation over his anal opening.  He does not have the symptoms with walking, standing, or doing other activities where prolonged sitting is not involved.  He does yoga and upon further questioning describes more dynamic stretching rather than static holds.  No injury, change in activity, skin changes, bleeding.  Past Medical History:  Diagnosis Date   Hyperlipidemia    Kidney stone      Past Surgical History:  Procedure Laterality Date   KIDNEY STONE SURGERY      Medications  Takes no meds routinely.    Allergies No Known Allergies  Family History Family History  Problem Relation Age of Onset   Hypertension Father     Review of Systems: Constitutional: no fevers or chills Eye:  no recent significant change in vision Ear/Nose/Mouth/Throat:  Ears:  no hearing loss Nose/Mouth/Throat:  no complaints of nasal congestion, no sore throat Cardiovascular:  no chest pain Respiratory:  no shortness of breath Gastrointestinal:  no abdominal pain, no change in bowel habits GU:  Male: negative for dysuria, frequency, and incontinence Musculoskeletal/Extremities:  no pain of the joints Integumentary (Skin/Breast):  no abnormal skin lesions reported Neurologic:  no headaches Endocrine: No unexpected weight changes Hematologic/Lymphatic:  no night sweats  Exam BP 130/82   Pulse 70   Temp 98.1 F (36.7 C)   Resp 18   Ht 5' 6  (1.676 m)   Wt 165 lb 3.2 oz (74.9 kg)   SpO2 95%   BMI 26.66 kg/m  General:  well developed, well nourished, in no apparent distress Skin:  no significant moles, warts, or growths; old hemorrhoid noted on the left anal opening.  No TTP or active bleeding.  No fissures. Head:  no masses, lesions, or tenderness Eyes:  pupils equal and round, sclera anicteric without injection Ears:  canals without lesions, TMs shiny without retraction, no obvious effusion, no erythema Nose:  nares patent, mucosa normal Throat/Pharynx:  lips and gingiva without lesion; tongue and uvula midline; non-inflamed pharynx; no exudates or postnasal drainage Neck: neck supple without adenopathy, thyromegaly, or masses Lungs:  clear to auscultation, breath sounds equal bilaterally, no respiratory distress Cardio:  regular rate and rhythm, no bruits, no LE edema Abdomen:  abdomen soft, nontender; bowel sounds normal; no masses or organomegaly Rectal: Deferred Musculoskeletal:  symmetrical muscle groups noted without atrophy or deformity; tight hamstrings bilaterally, worse on the right. Extremities:  no clubbing, cyanosis, or edema, no deformities, no skin discoloration Neuro:  gait normal; deep tendon reflexes normal and symmetric Psych: well oriented with normal range of affect and appropriate judgment/insight  Assessment and Plan  Well adult exam - Plan: CBC, Comprehensive metabolic panel with GFR, Hepatitis B surface antibody,quantitative, Lipid panel  Recurrent kidney stones - Plan: Ambulatory referral to Urology  Hamstring tightness   Well 43 y.o. male. Counseled on diet and exercise. Advanced directive form requested today.  Patient has a history of recurrent kidney stones.  His current urologist is in [place] and he would prefer something closer.  Will refer to Dr. Roseann upstairs. Counseled on risks and benefits of prostate cancer screening with PSA. The patient agrees to forego screening.  Other  orders as above. Hamstring tightness: Heat, ice, Tylenol, stretches and exercises.  Suspect proximal hamstrings are irritating pudendal nerve causing burning.  I suspect both of these should improve.  Consider physical therapy if no improvement over the next 3 to 4 weeks. Follow up in 1 yr pending the above workup. The patient voiced understanding and agreement to the plan.  Mabel [place], DO 12/02/23 9:02 AM

## 2023-12-02 NOTE — Patient Instructions (Addendum)
 Give us  2-3 business days to get the results of your labs back.   Keep the diet clean and stay active.  Please get me a copy of your advanced directive form at your convenience.   I recommend getting the flu shot in mid October. This suggestion would change if the CDC comes out with a different recommendation.   If you do not hear anything about your referral in the next 1-2 weeks, call our office and ask for an update.  Heat (pad or rice pillow in microwave) over affected area, 10-15 minutes twice daily.   Ice/cold pack over area for 10-15 min twice daily.  OK to take Tylenol 1000 mg (2 extra strength tabs) or 975 mg (3 regular strength tabs) every 6 hours as needed.  Send me a message in 3-4 weeks if we aren't turning the corner.   Let us  know if you need anything.  Semimembranosus Tendinitis Rehab  It is normal to feel mild stretching, pulling, tightness, or discomfort as you do these exercises, but you should stop right away if you feel sudden pain or your pain gets worse.  Stretching and range of motion exercises These exercises warm up your muscles and joints and improve the movement and flexibility of your thigh. These exercises also help to relieve pain, numbness, and tingling. Exercise A: Hamstring stretch, supine    Lie on your back. Loop a belt or towel across the ball of your left / right foot The ball of your foot is on the walking surface, right under your toes. Straighten your left / right knee and slowly pull on the belt to raise your leg. Stop when you feel a gentle stretch behind your left / right knee or thigh. Do not allow the knee to bend. Keep your other leg flat on the floor. Hold this position for 30 seconds. Repeat 2 times. Complete this exercise 3 times a week. Strengthening exercises These exercises build strength and endurance in your thigh. Endurance is the ability to use your muscles for a long time, even after they get tired. Exercise B: Straight leg  raises (hip extensors) Lie on your belly on a bed or a firm surface with a pillow under your hips. Bend your left / right knee so your foot is straight up in the air. Squeeze your buttock muscles and lift your left / right thigh off the bed. Do not let your back arch. Hold this position for 3 seconds. Slowly return to the starting position. Let your muscles relax completely before you do another repetition. Repeat 2 times. Complete this exercise 3 times a week. Exercise C: Bridge (hip extensors)     Lie on your back on a firm surface with your knees bent and your feet flat on the floor. Tighten your buttocks muscles and lift your bottom off the floor until your trunk is level with your thighs. You should feel the muscles working in your buttocks and the back of your thighs. If you do not feel these muscles, slide your feet 1-2 inches (2.5-5 cm) farther away from your buttocks. Do not arch your back. Hold this position for 3 seconds. Slowly lower your hips to the starting position. Let your buttocks muscles relax completely between repetitions. If this exercise is too easy, try doing it with your arms crossed over your chest. Repeat 2 times. Complete this exercise 3 times a week. Exercise D: Hamstring eccentric, prone Lie on your belly on a bed or on the floor. Start  with your legs straight. Cross your legs at the ankles with your left / right leg on top. Using your bottom leg to do the work, bend both knees. Using just your left / right leg alone, slowly lower your leg back down toward the bed. Add a 5 lb weight as told by your health care provider. Let your muscles relax completely between repetitions. Repeat 2 times. Complete this exercise 3 times a week. Exercise E: Squats Stand in front of a table, with your feet and knees pointing straight ahead. You may rest your hands on the table for balance but not for support. Slowly bend your knees and lower your hips like you are going to sit  in a chair. Keep your thighs straight or pointed slightly outward. Keep your weight over your heels, not over your toes. Keep your lower legs upright so they are parallel with the table legs. Do not let your hips go lower than your knees. Stop when your knees are bent to the shape of an upside-down letter L (90 degree angle). Do not bend lower than told by your health care provider. If your knee pain increases, do not bend as low. Hold the squat position 1-2 seconds. Slowly push with your legs to return to standing. Do not use your hands to pull yourself to standing. Repeat 2 times. Complete this exercise 3 times a week. Make sure you discuss any questions you have with your health care provider. Document Released: 03/15/2005 Document Revised: 11/20/2015 Document Reviewed: 12/17/2014 Elsevier Interactive Patient Education  [employer].

## 2023-12-03 LAB — HEPATITIS B SURFACE ANTIBODY, QUANTITATIVE: Hep B S AB Quant (Post): 20 m[IU]/mL (ref >=10)

## 2023-12-07 ENCOUNTER — Encounter: Admitting: Family Medicine
